# Patient Record
Sex: Female | Born: 1940 | Race: White | Hispanic: No | Smoking: Former smoker
Health system: Southern US, Community
[De-identification: ages and names within clinical notes are randomized; demographics above are authoritative.]

## PROBLEM LIST (undated history)

## (undated) DIAGNOSIS — E119 Type 2 diabetes mellitus without complications: Secondary | ICD-10-CM

## (undated) DIAGNOSIS — N289 Disorder of kidney and ureter, unspecified: Secondary | ICD-10-CM

## (undated) DIAGNOSIS — K219 Gastro-esophageal reflux disease without esophagitis: Secondary | ICD-10-CM

## (undated) DIAGNOSIS — H44813 Hemophthalmos, bilateral: Secondary | ICD-10-CM

## (undated) DIAGNOSIS — I10 Essential (primary) hypertension: Secondary | ICD-10-CM

## (undated) DIAGNOSIS — M1990 Unspecified osteoarthritis, unspecified site: Secondary | ICD-10-CM

## (undated) DIAGNOSIS — E079 Disorder of thyroid, unspecified: Secondary | ICD-10-CM

## (undated) DIAGNOSIS — N189 Chronic kidney disease, unspecified: Secondary | ICD-10-CM

## (undated) DIAGNOSIS — E559 Vitamin D deficiency, unspecified: Secondary | ICD-10-CM

## (undated) DIAGNOSIS — G4733 Obstructive sleep apnea (adult) (pediatric): Secondary | ICD-10-CM

## (undated) DIAGNOSIS — I2510 Atherosclerotic heart disease of native coronary artery without angina pectoris: Secondary | ICD-10-CM

## (undated) HISTORY — PX: TONSILLECTOMY: SUR1361

## (undated) HISTORY — PX: CHOLECYSTECTOMY: SHX55

## (undated) HISTORY — PX: CORONARY ANGIOPLASTY: SHX604

---

## 2004-08-03 ENCOUNTER — Ambulatory Visit: Payer: Self-pay | Admitting: Gastroenterology

## 2004-10-14 ENCOUNTER — Emergency Department: Payer: Self-pay | Admitting: Unknown Physician Specialty

## 2005-07-31 ENCOUNTER — Ambulatory Visit: Payer: Self-pay | Admitting: Internal Medicine

## 2005-09-15 ENCOUNTER — Ambulatory Visit: Payer: Self-pay | Admitting: Internal Medicine

## 2005-09-16 ENCOUNTER — Ambulatory Visit: Payer: Self-pay | Admitting: Internal Medicine

## 2005-09-21 ENCOUNTER — Ambulatory Visit: Payer: Self-pay | Admitting: Internal Medicine

## 2006-10-24 ENCOUNTER — Ambulatory Visit: Payer: Self-pay | Admitting: Internal Medicine

## 2007-10-15 ENCOUNTER — Inpatient Hospital Stay: Payer: Self-pay | Admitting: Internal Medicine

## 2007-12-04 ENCOUNTER — Ambulatory Visit: Payer: Self-pay | Admitting: Internal Medicine

## 2008-12-09 ENCOUNTER — Ambulatory Visit: Payer: Self-pay | Admitting: Internal Medicine

## 2009-01-11 ENCOUNTER — Emergency Department: Payer: Self-pay | Admitting: Emergency Medicine

## 2009-07-05 ENCOUNTER — Ambulatory Visit: Payer: Self-pay | Admitting: Family Medicine

## 2010-02-16 ENCOUNTER — Ambulatory Visit: Payer: Self-pay | Admitting: Internal Medicine

## 2011-03-09 ENCOUNTER — Ambulatory Visit: Payer: Self-pay | Admitting: Internal Medicine

## 2011-03-09 ENCOUNTER — Ambulatory Visit: Payer: Self-pay | Admitting: Gastroenterology

## 2011-03-11 ENCOUNTER — Ambulatory Visit: Payer: Self-pay | Admitting: Gastroenterology

## 2011-03-29 ENCOUNTER — Ambulatory Visit: Payer: Self-pay | Admitting: Gastroenterology

## 2011-04-01 LAB — PATHOLOGY REPORT

## 2011-04-12 ENCOUNTER — Other Ambulatory Visit: Payer: Self-pay | Admitting: Gastroenterology

## 2011-05-31 ENCOUNTER — Emergency Department: Payer: Self-pay | Admitting: Emergency Medicine

## 2011-08-08 ENCOUNTER — Ambulatory Visit: Payer: Self-pay | Admitting: Surgery

## 2011-08-09 LAB — PATHOLOGY REPORT

## 2012-07-02 ENCOUNTER — Ambulatory Visit: Payer: Self-pay | Admitting: Internal Medicine

## 2012-07-14 ENCOUNTER — Emergency Department: Payer: Self-pay | Admitting: Emergency Medicine

## 2012-07-14 LAB — RAPID INFLUENZA A&B ANTIGENS

## 2013-03-24 ENCOUNTER — Inpatient Hospital Stay: Payer: Self-pay | Admitting: Internal Medicine

## 2013-03-24 LAB — CBC
HCT: 42.1 % (ref 35.0–47.0)
HGB: 14.3 g/dL (ref 12.0–16.0)
MCH: 32.6 pg (ref 26.0–34.0)
MCHC: 33.9 g/dL (ref 32.0–36.0)
MCV: 96 fL (ref 80–100)
Platelet: 176 10*3/uL (ref 150–440)
RBC: 4.38 10*6/uL (ref 3.80–5.20)

## 2013-03-24 LAB — URINALYSIS, COMPLETE
Hyaline Cast: 14
Nitrite: NEGATIVE
WBC UR: 11 /HPF (ref 0–5)

## 2013-03-24 LAB — COMPREHENSIVE METABOLIC PANEL
Alkaline Phosphatase: 72 U/L (ref 50–136)
Bilirubin,Total: 0.7 mg/dL (ref 0.2–1.0)
Chloride: 101 mmol/L (ref 98–107)
Co2: 28 mmol/L (ref 21–32)
Creatinine: 1.53 mg/dL — ABNORMAL HIGH (ref 0.60–1.30)
Osmolality: 279 (ref 275–301)
Potassium: 3.5 mmol/L (ref 3.5–5.1)
SGPT (ALT): 17 U/L (ref 12–78)
Total Protein: 7.5 g/dL (ref 6.4–8.2)

## 2013-03-24 LAB — PRO B NATRIURETIC PEPTIDE: B-Type Natriuretic Peptide: 825 pg/mL — ABNORMAL HIGH (ref 0–125)

## 2013-03-25 LAB — CBC WITH DIFFERENTIAL/PLATELET
Eosinophil #: 0 10*3/uL (ref 0.0–0.7)
HCT: 37.5 % (ref 35.0–47.0)
HGB: 12.9 g/dL (ref 12.0–16.0)
MCHC: 34.3 g/dL (ref 32.0–36.0)
Monocyte #: 0.7 x10 3/mm (ref 0.2–0.9)
Monocyte %: 6.7 %
Neutrophil %: 87.1 %
RDW: 13.4 % (ref 11.5–14.5)

## 2013-03-25 LAB — BASIC METABOLIC PANEL
Calcium, Total: 8 mg/dL — ABNORMAL LOW (ref 8.5–10.1)
Chloride: 103 mmol/L (ref 98–107)
Co2: 26 mmol/L (ref 21–32)
Creatinine: 1.76 mg/dL — ABNORMAL HIGH (ref 0.60–1.30)
EGFR (African American): 33 — ABNORMAL LOW
EGFR (Non-African Amer.): 28 — ABNORMAL LOW
Glucose: 251 mg/dL — ABNORMAL HIGH (ref 65–99)
Osmolality: 288 (ref 275–301)
Potassium: 3.8 mmol/L (ref 3.5–5.1)
Sodium: 137 mmol/L (ref 136–145)

## 2013-03-26 LAB — CBC WITH DIFFERENTIAL/PLATELET
Basophil #: 0 10*3/uL (ref 0.0–0.1)
Eosinophil #: 0 10*3/uL (ref 0.0–0.7)
Eosinophil %: 0 %
HCT: 31.7 % — ABNORMAL LOW (ref 35.0–47.0)
HGB: 11.1 g/dL — ABNORMAL LOW (ref 12.0–16.0)
MCH: 33.2 pg (ref 26.0–34.0)
MCV: 95 fL (ref 80–100)
Neutrophil #: 7.6 10*3/uL — ABNORMAL HIGH (ref 1.4–6.5)
Neutrophil %: 81.4 %
RBC: 3.34 10*6/uL — ABNORMAL LOW (ref 3.80–5.20)
WBC: 9.3 10*3/uL (ref 3.6–11.0)

## 2013-03-26 LAB — BASIC METABOLIC PANEL
Anion Gap: 4 — ABNORMAL LOW (ref 7–16)
BUN: 33 mg/dL — ABNORMAL HIGH (ref 7–18)
Calcium, Total: 7.8 mg/dL — ABNORMAL LOW (ref 8.5–10.1)
Co2: 28 mmol/L (ref 21–32)
Creatinine: 1.71 mg/dL — ABNORMAL HIGH (ref 0.60–1.30)
Osmolality: 281 (ref 275–301)
Potassium: 3.9 mmol/L (ref 3.5–5.1)
Sodium: 137 mmol/L (ref 136–145)

## 2013-03-27 LAB — URINALYSIS, COMPLETE
Bilirubin,UR: NEGATIVE
Glucose,UR: NEGATIVE mg/dL (ref 0–75)
Leukocyte Esterase: NEGATIVE
Nitrite: NEGATIVE
Ph: 5 (ref 4.5–8.0)
Squamous Epithelial: NONE SEEN
WBC UR: <1 /HPF (ref 0–5)

## 2013-03-27 LAB — BASIC METABOLIC PANEL
Anion Gap: 5 — ABNORMAL LOW (ref 7–16)
Calcium, Total: 8.1 mg/dL — ABNORMAL LOW (ref 8.5–10.1)
Co2: 28 mmol/L (ref 21–32)
Creatinine: 1.34 mg/dL — ABNORMAL HIGH (ref 0.60–1.30)
EGFR (African American): 46 — ABNORMAL LOW
EGFR (Non-African Amer.): 39 — ABNORMAL LOW
Potassium: 3.5 mmol/L (ref 3.5–5.1)

## 2013-03-27 LAB — CREATININE, URINE, RANDOM: Creatinine, Urine Random: 51.7 mg/dL (ref 30.0–125.0)

## 2013-03-27 LAB — SODIUM, URINE, RANDOM: Sodium, Urine Random: 60 mmol/L (ref 20–110)

## 2013-03-27 LAB — HEMATOCRIT: HCT: 32.8 % — ABNORMAL LOW (ref 35.0–47.0)

## 2013-03-28 LAB — BASIC METABOLIC PANEL
Anion Gap: 5 — ABNORMAL LOW (ref 7–16)
Calcium, Total: 8 mg/dL — ABNORMAL LOW (ref 8.5–10.1)
Chloride: 107 mmol/L (ref 98–107)
Co2: 27 mmol/L (ref 21–32)
Creatinine: 1.22 mg/dL (ref 0.60–1.30)
Sodium: 139 mmol/L (ref 136–145)

## 2013-03-29 LAB — CULTURE, BLOOD (SINGLE)

## 2013-04-22 LAB — EXPECTORATED SPUTUM ASSESSMENT W REFEX TO RESP CULTURE

## 2013-09-10 ENCOUNTER — Ambulatory Visit: Payer: Self-pay | Admitting: Internal Medicine

## 2014-10-24 NOTE — Discharge Summary (Signed)
PATIENT NAME:  Suzanne Francis, Suzanne Francis MR#:  [id number] DATE OF BIRTH:  1940/08/27  DATE OF ADMISSION:  03/24/2013 DATE OF DISCHARGE:  03/28/2013  FINAL DIAGNOSES: 1.  Pneumonia.  2.  Systemic inflammatory response syndrome secondary to #1.  3.  Acute renal failure.  4.  Adult onset diabetes mellitus, uncontrolled.  5.  Sleep apnea.  6.  Gastroesophageal reflux disease.  7 . Depression/anxiety.  8.  Hypothyroidism.   HISTORY AND PHYSICAL: Please see dictated admission history and physical.   SUMMARY OF HOSPITAL COURSE: The patient was admitted with pneumonia with evidence of systemic inflammatory response syndrome, also developed acute renal failure thought to be secondary to acute tubular necrosis from the systemic inflammatory response syndrome. She was placed on antibiotics and showed improvement. She was changed over to Ceftin and Zithromax, however, she spiked fevers on this and so she was changed over to Levaquin. With this regimen she remained afebrile. She is ambulating well, saturation improved to the point that she was at 95% on room air on day of discharge. Blood pressure medications were initially held secondary to hypotension. These were reinstituted. Metformin was held secondary to acute renal failure. Creatinine improved to baseline, however, she did not require the metformin at this point and so it continued to be held. She did have evidence of urinary tract infection as well, which was well covered with the Levaquin.   At this time, the patient will be discharged to home in stable condition with physical activity to be up as tolerated. She may return to work on Monday. We will have her follow up in the office within the next 2 weeks. Her diet should be 2 grams sodium, 1800 calorie ADA diet. She should check her blood sugar daily and record this.   DISCHARGE MEDICATIONS: 1.  Multivitamin 1 p.o. daily.  2.  Calcium 600 mg p.o. daily. 3.  Actos 45 mg p.o. daily.  4.   Hydrochlorothiazide 25 mg p.o. daily.  5.  Glipizide XL 10 mg p.o. daily.  6.  Pantoprazole 40 mg p.o. daily.  7.  Levothyroxine 0.112 mg p.o. daily.  8.  Lexapro 10 mg p.o. daily.  9.  Lisinopril 40 mg 1/2 tablet p.o. daily.  10.  Tessalon 100 mg p.o. q. 6 hours as needed for cough.  11.  Levaquin 500 mg p.o. daily x 10 days to complete 14 day course.   NOTE:  She will hold metformin at this point. If her blood sugars are getting over 150, she can restart this medication at 1000 mg p.o. b.i.d.  ____________________________ Lynnea FerrierBert J. Klein III, MD bjk:sb D: 03/28/2013 08:17:48 ET T: 03/28/2013 08:34:02 ET JOB#: [id number]  cc: Curtis SitesBert J. Klein III, MD, <Dictator> Daniel NonesBERT KLEIN MD ELECTRONICALLY SIGNED 04/03/2013 13:03

## 2014-10-24 NOTE — H&P (Signed)
PATIENT NAME:  Suzanne Francis, Suzanne Francis MR#:  [id number] DATE OF BIRTH:  07/08/1940  DATE OF ADMISSION:  03/24/2013  PRIMARY CARE PHYSICIAN: Dr. Daniel Nones.   CHIEF COMPLAINT: Cough and fever for 2 to 3 days.   HISTORY OF PRESENT ILLNESS: Suzanne Francis is a very pleasant, 74 year old, Caucasian female with a history of sleep apnea, hypertension and type 2 diabetes who comes to the Emergency Room accompanied by her daughter with increasing dry cough and shortness of breath along with fever of 103. The patient was found to be tachycardic, and her chest x-ray shows developing left lower lobe infiltrate. She is being admitted with SIRS secondary to pneumonia. The patient denies any productive phlegm. She has been having pain in her back with coughing spells. Her sats are 97% on 2 liters. She received IV Rocephin and Zithromax in the Emergency Room.   PAST MEDICAL HISTORY:  1. Hypothyroidism.  2. Hypertension.  3. Type 2 diabetes.  4. Sleep apnea, wears CPAP at home.  5. Acid reflux/hiatal hernia.  6. Depression and anxiety.   PAST SURGICAL HISTORY:  1. Tonsillectomy and adenoidectomy.  2. Tubal ligation.   ALLERGIES: No known drug allergies.   MEDICATIONS:  1. Pioglitazone 45 mg p.o. daily.  2. Protonix 40 mg daily.  3. Metformin 1000 mg b.i.d.  4. Lisinopril 40 mg once a day.  5. Levothyroxine 112 mcg p.o. daily.  6. Hydrochlorothiazide 25 mg daily.  7. Glipizide 10 mg daily.  8. Escitalopram 10 mg daily.  9. Centrum Silver p.o. daily.  10. Caltrate with vitamin D 1 tablet daily.  11. Biotin 5000 mcg daily.  12. Albuterol 1 puff 4 times a day.   SOCIAL HISTORY: Lives at home by herself. The patient is a former smoker. Denies any alcohol drinking.   FAMILY HISTORY: Positive for hypertension and diabetes.   REVIEW OF SYSTEMS:   CONSTITUTIONAL: Positive for fever, fatigue, weakness.  EYES: No blurred or double vision or glaucoma or cataracts.  EARS, NOSE, THROAT: No tinnitus, ear pain,  hearing loss or postnasal drip.  RESPIRATORY: Positive for cough, shortness of breath and COPD.   CARDIOVASCULAR: Positive for chest pain on coughing spells. No orthopnea, edema. Positive for hypertension.  GASTROINTESTINAL: No nausea, vomiting, diarrhea, abdominal pain or hematemesis.  GENITOURINARY: No dysuria, hematuria, frequency or incontinence.  ENDOCRINE: No polyuria, nocturia or thyroid problems.  HEMATOLOGY: No anemia or easy bruising or bleeding.  SKIN: No acne or rash.  MUSCULOSKELETAL: Positive for arthritis. No swelling or gout.  NEUROLOGIC: CVA, TIA, weakness or ataxia.  PSYCHIATRIC: Positive for anxiety and depression. No bipolar disorder or schizophrenia.   All other systems reviewed and negative.   PHYSICAL EXAMINATION:  GENERAL: The patient is awake, alert, oriented x3, not in acute distress.  VITAL SIGNS: Temperature 103.2, pulse 103, respirations 20, blood pressure 127/63. Sats are 97% on 2 liters.  HEENT: Atraumatic, normocephalic. PERRLA. EOM intact. Oral mucosa is dry.  NECK: Supple. No JVD. No carotid bruit.  RESPIRATORY: There are decreased breath sounds at the bases and a few crackles heard bilaterally at the bases more on the left than the right. No respiratory distress or use of accessory muscles.  CARDIOVASCULAR: Tachycardia present. No murmur heard. PMI nonlateralized. Chest nontender.  EXTREMITIES: Good pedal pulses, good femoral pulses. No lower extremity edema.  ABDOMEN: Soft, benign, nontender. No organomegaly. Positive bowel sounds.  NEUROLOGIC: Grossly intact cranial nerves II through XII. No motor or sensory deficit.  PSYCHIATRIC: The patient is awake, alert, oriented  x3.  SKIN: Warm and dry.   LABORATORY DATA: CBC within normal limits. Comprehensive metabolic panel within normal limits except creatinine of 1.5, glucose of 157. Troponin of 0.02. Chest x-ray shows left lower lobe airspace disease concerning for pneumonia.   ASSESSMENT AND PLAN: Suzanne Francis is a 74 year old with history of hypertension and type 2 diabetes, comes in with shortness of breath, dry cough and high-grade fever, is being admitted with:  1. Systemic inflammatory response syndrome secondary to left lower lobe pneumonia. The patient has temperature of 103. She is tachycardic with a normal chest x-ray. Will admit the patient on medical floor with off unit telemetry. Continue intravenous Rocephin and Zithromax. Follow up blood cultures and sputum culture if patient is able to produce any. Give nebulizers as needed.  2. Type 2 diabetes: Will place the patient on sliding scale insulin and continue home medications.  3. Mild renal insufficiency/acute renal failure in the setting of systemic inflammatory response syndrome due to pneumonia: Will give intravenous fluids for hydration. Monitor creatinine.   4. Gastroesophageal reflux disease: Continue Protonix.  5. Depression and anxiety: Will continue Lexapro.  6. History of hypertension: I will hold off on the patient's lisinopril at this time because of elevated creatinine. Will hydrate her and resume lisinopril tomorrow once creatinine is back to the patient's baseline.   7. Hypothyroidism: Continue Synthroid.  8. Further workup according to the patient's clinical course. Hospital admission plan was discussed with the patient and the patient's daughter.   CRITICAL TIME SPENT: 50 minutes.   ____________________________ Wylie HailSona A. Allena KatzPatel, MD sap:gb D: 03/24/2013 18:04:02 ET T: 03/24/2013 18:24:02 ET JOB#: [id number]  cc: Victoriana Aziz A. Allena KatzPatel, MD, <Dictator> Curtis SitesBert J. Klein III, MD Willow OraSONA A Juriel Cid MD ELECTRONICALLY SIGNED 03/25/2013 13:13

## 2014-10-26 NOTE — Op Note (Signed)
PATIENT NAME:  Suzanne Francis, Suzanne Francis MR#:  [id number] DATE OF BIRTH:  04-13-1941  DATE OF PROCEDURE:  08/08/2011  PREOPERATIVE DIAGNOSIS: Chronic cholecystitis, cholelithiasis.   POSTOPERATIVE DIAGNOSIS: Chronic cholecystitis, cholelithiasis.   PROCEDURE: Laparoscopic cholecystectomy, cholangiogram.   SURGEON: J. Renda RollsWilton Lyncoln Maskell, MD   ASSISTANT: Carmell AustriaAnn Collins, PA  ANESTHESIA: General.   INDICATIONS: This 74 year old female has a history of epigastric pains and ultrasound findings of large gallstones and surgery was recommended for definitive treatment.   DESCRIPTION OF PROCEDURE: The patient was placed on the operating table in the supine position under general endotracheal anesthesia. The abdomen was prepared with ChloraPrep and draped in a sterile manner.   A short incision was made in the inferior aspect of the umbilicus and carried down to the deep fascia which was grasped with laryngeal hook and elevated. A Veress needle was inserted, aspirated, and irrigated with a saline solution. Next, the peritoneal cavity was inflated with carbon dioxide. The Veress needle was removed. The 10 mm cannula was inserted. The 10 mm 0 degree laparoscope was inserted to view the peritoneal cavity. The liver appeared normal. The gallbladder appeared to have a slightly thickened wall. Another incision was made in the epigastrium slightly to the right of the midline to introduce a 10 mm cannula. Next, two incisions were made in the lateral aspect of the right upper quadrant to introduce two 5-mm cannulas.   The gallbladder was retracted towards the right shoulder. The infundibulum was retracted inferiorly and laterally. The porta hepatis was demonstrated. The neck of the gallbladder was mobilized with incision of visceral peritoneum. The cystic duct was dissected free from surrounding structures. The cystic artery was dissected free from surrounding structures. A critical view of safety was demonstrated. An Endoclip was  placed across the cystic duct adjacent to the neck of the gallbladder. An incision was made in the cystic duct to introduce a Reddick catheter. Half-strength Conray-60 dye was injected as the cholangiogram was done with fluoroscopy. This demonstrated biliary tree and prompt flow of dye into the duodenum. No retained stones were seen. The cholangiogram appeared normal. The cholangiocatheter was removed. The cystic duct was doubly ligated with endoclips and divided. The cystic artery was controlled with a single Endoclip and divided The gallbladder was dissected free from the liver with hook and cautery. Bleeding was very scant. Hemostasis was subsequently intact. The site was irrigated with heparinized saline solution and aspirated. The gallbladder was brought up through the infraumbilical incision, opened and suctioned. There was a large stone which was crushed and removed in a piecemeal fashion. The gallbladder with stones were then removed and submitted in formalin for routine pathology. There was some oozing from the infraumbilical port site as seen with the laparoscope and one small bleeding point was cauterized. The site was infiltrated with 1% Xylocaine with epinephrine. Hemostasis subsequently appeared to be intact. The other cannulas were removed allowing carbon dioxide to escape from the peritoneal cavity. Several tiny bleeding points in the subcutaneous tissues were cauterized. Each wound was infiltrated with Xylocaine with epinephrine. Hemostasis was intact. The wounds were closed with interrupted 5-0 chromic subcuticular sutures, benzoin, and Steri-Strips. Dressings were applied with paper tape. The patient is now being prepared for transfer to the recovery room.   ____________________________ Shela CommonsJ. Renda RollsWilton Dameka Younker, MD jws:drc D: 08/08/2011 08:37:46 ET T: 08/08/2011 10:20:42 ET JOB#: [id number]  cc: Adella HareJ. Wilton Sharin Altidor, MD, <Dictator> Adella HareWILTON J Vonne Mcdanel MD ELECTRONICALLY SIGNED 08/13/2011 13:55

## 2015-03-24 ENCOUNTER — Inpatient Hospital Stay
Admission: EM | Admit: 2015-03-24 | Discharge: 2015-03-27 | DRG: 247 | Disposition: A | Discharge status: Home / Self Care | Payer: Medicare Other | Attending: Internal Medicine | Admitting: Internal Medicine

## 2015-03-24 ENCOUNTER — Emergency Department: Payer: Medicare Other

## 2015-03-24 ENCOUNTER — Encounter: Payer: Self-pay | Admitting: Emergency Medicine

## 2015-03-24 ENCOUNTER — Inpatient Hospital Stay
Admit: 2015-03-24 | Discharge: 2015-03-24 | Disposition: A | Discharge status: Home / Self Care | Payer: Medicare Other | Attending: Internal Medicine | Admitting: Internal Medicine

## 2015-03-24 DIAGNOSIS — I25110 Atherosclerotic heart disease of native coronary artery with unstable angina pectoris: Secondary | ICD-10-CM | POA: Diagnosis present

## 2015-03-24 DIAGNOSIS — E039 Hypothyroidism, unspecified: Secondary | ICD-10-CM | POA: Diagnosis present

## 2015-03-24 DIAGNOSIS — Z7982 Long term (current) use of aspirin: Secondary | ICD-10-CM | POA: Diagnosis not present

## 2015-03-24 DIAGNOSIS — I129 Hypertensive chronic kidney disease with stage 1 through stage 4 chronic kidney disease, or unspecified chronic kidney disease: Secondary | ICD-10-CM | POA: Diagnosis present

## 2015-03-24 DIAGNOSIS — Z880 Allergy status to penicillin: Secondary | ICD-10-CM

## 2015-03-24 DIAGNOSIS — Z87891 Personal history of nicotine dependence: Secondary | ICD-10-CM

## 2015-03-24 DIAGNOSIS — G4733 Obstructive sleep apnea (adult) (pediatric): Secondary | ICD-10-CM | POA: Diagnosis present

## 2015-03-24 DIAGNOSIS — N189 Chronic kidney disease, unspecified: Secondary | ICD-10-CM | POA: Diagnosis present

## 2015-03-24 DIAGNOSIS — Z794 Long term (current) use of insulin: Secondary | ICD-10-CM | POA: Diagnosis not present

## 2015-03-24 DIAGNOSIS — Z79899 Other long term (current) drug therapy: Secondary | ICD-10-CM | POA: Diagnosis not present

## 2015-03-24 DIAGNOSIS — J984 Other disorders of lung: Secondary | ICD-10-CM | POA: Diagnosis present

## 2015-03-24 DIAGNOSIS — K219 Gastro-esophageal reflux disease without esophagitis: Secondary | ICD-10-CM | POA: Diagnosis present

## 2015-03-24 DIAGNOSIS — E1122 Type 2 diabetes mellitus with diabetic chronic kidney disease: Secondary | ICD-10-CM | POA: Diagnosis present

## 2015-03-24 DIAGNOSIS — E559 Vitamin D deficiency, unspecified: Secondary | ICD-10-CM | POA: Diagnosis present

## 2015-03-24 DIAGNOSIS — Z6841 Body Mass Index (BMI) 40.0 and over, adult: Secondary | ICD-10-CM | POA: Diagnosis not present

## 2015-03-24 DIAGNOSIS — I214 Non-ST elevation (NSTEMI) myocardial infarction: Secondary | ICD-10-CM | POA: Diagnosis present

## 2015-03-24 DIAGNOSIS — R079 Chest pain, unspecified: Secondary | ICD-10-CM

## 2015-03-24 DIAGNOSIS — R0902 Hypoxemia: Secondary | ICD-10-CM | POA: Diagnosis not present

## 2015-03-24 DIAGNOSIS — E669 Obesity, unspecified: Secondary | ICD-10-CM | POA: Diagnosis present

## 2015-03-24 HISTORY — DX: Essential (primary) hypertension: I10

## 2015-03-24 HISTORY — DX: Gastro-esophageal reflux disease without esophagitis: K21.9

## 2015-03-24 HISTORY — DX: Type 2 diabetes mellitus without complications: E11.9

## 2015-03-24 HISTORY — DX: Vitamin D deficiency, unspecified: E55.9

## 2015-03-24 HISTORY — DX: Obstructive sleep apnea (adult) (pediatric): G47.33

## 2015-03-24 HISTORY — DX: Disorder of thyroid, unspecified: E07.9

## 2015-03-24 HISTORY — DX: Chronic kidney disease, unspecified: N18.9

## 2015-03-24 LAB — CBC
HCT: 44.5 % (ref 35.0–47.0)
HEMOGLOBIN: 14.6 g/dL (ref 12.0–16.0)
MCH: 31.8 pg (ref 26.0–34.0)
MCHC: 32.9 g/dL (ref 32.0–36.0)
MCV: 96.6 fL (ref 80.0–100.0)
Platelets: 251 10*3/uL (ref 150–440)
RBC: 4.6 MIL/uL (ref 3.80–5.20)
RDW: 12.8 % (ref 11.5–14.5)
WBC: 6.1 10*3/uL (ref 3.6–11.0)

## 2015-03-24 LAB — BASIC METABOLIC PANEL
ANION GAP: 10 (ref 5–15)
BUN: 21 mg/dL — AB (ref 6–20)
CALCIUM: 10 mg/dL (ref 8.9–10.3)
CO2: 29 mmol/L (ref 22–32)
Chloride: 101 mmol/L (ref 101–111)
Creatinine, Ser: 1.23 mg/dL — ABNORMAL HIGH (ref 0.44–1.00)
GFR calc Af Amer: 49 mL/min — ABNORMAL LOW (ref >60)
GFR, EST NON AFRICAN AMERICAN: 42 mL/min — AB (ref >60)
GLUCOSE: 207 mg/dL — AB (ref 65–99)
Potassium: 3.7 mmol/L (ref 3.5–5.1)
Sodium: 140 mmol/L (ref 135–145)

## 2015-03-24 LAB — PROTIME-INR
INR: 0.93
Prothrombin Time: 12.7 seconds (ref 11.4–15.0)

## 2015-03-24 LAB — LIPID PANEL
CHOLESTEROL: 188 mg/dL (ref 0–200)
HDL: 43 mg/dL (ref >40)
LDL Cholesterol: 104 mg/dL — ABNORMAL HIGH (ref 0–99)
TRIGLYCERIDES: 204 mg/dL — AB (ref <150)
Total CHOL/HDL Ratio: 4.4 RATIO
VLDL: 41 mg/dL — ABNORMAL HIGH (ref 0–40)

## 2015-03-24 LAB — APTT: aPTT: 29 seconds (ref 24–36)

## 2015-03-24 LAB — TROPONIN I
TROPONIN I: 0.47 ng/mL — AB (ref <0.031)
Troponin I: 0.34 ng/mL — ABNORMAL HIGH (ref <0.031)
Troponin I: 0.62 ng/mL — ABNORMAL HIGH (ref <0.031)

## 2015-03-24 LAB — GLUCOSE, CAPILLARY: Glucose-Capillary: 158 mg/dL — ABNORMAL HIGH (ref 65–99)

## 2015-03-24 MED ORDER — PIOGLITAZONE HCL 30 MG PO TABS
30.0000 mg | ORAL_TABLET | Freq: Every day | ORAL | Status: DC
  Administered 2015-03-26 – 2015-03-27 (×2): 30 mg via ORAL
  Filled 2015-03-24 (×3): qty 1

## 2015-03-24 MED ORDER — HEPARIN BOLUS VIA INFUSION
3900.0000 [IU] | Freq: Once | INTRAVENOUS | Status: AC
  Administered 2015-03-24: 3900 [IU] via INTRAVENOUS
  Filled 2015-03-24: qty 3900

## 2015-03-24 MED ORDER — INSULIN ASPART 100 UNIT/ML ~~LOC~~ SOLN
0.0000 [IU] | Freq: Three times a day (TID) | SUBCUTANEOUS | Status: DC
  Administered 2015-03-26: 2 [IU] via SUBCUTANEOUS
  Administered 2015-03-26 – 2015-03-27 (×3): 1 [IU] via SUBCUTANEOUS
  Filled 2015-03-24: qty 2
  Filled 2015-03-24 (×3): qty 1

## 2015-03-24 MED ORDER — ONDANSETRON HCL 4 MG/2ML IJ SOLN
INTRAMUSCULAR | Status: AC
  Administered 2015-03-24: 4 mg via INTRAVENOUS
  Filled 2015-03-24: qty 2

## 2015-03-24 MED ORDER — INSULIN ASPART 100 UNIT/ML ~~LOC~~ SOLN: 0.0000 [IU] | Freq: Every day | SUBCUTANEOUS | Status: DC

## 2015-03-24 MED ORDER — ROSUVASTATIN CALCIUM 20 MG PO TABS
20.0000 mg | ORAL_TABLET | Freq: Every day | ORAL | Status: DC
  Administered 2015-03-26: 20 mg via ORAL
  Filled 2015-03-24 (×2): qty 1

## 2015-03-24 MED ORDER — MORPHINE SULFATE (PF) 4 MG/ML IV SOLN
4.0000 mg | Freq: Once | INTRAVENOUS | Status: AC
  Administered 2015-03-24: 4 mg via INTRAVENOUS

## 2015-03-24 MED ORDER — MORPHINE SULFATE (PF) 4 MG/ML IV SOLN
INTRAVENOUS | Status: AC
  Administered 2015-03-24: 4 mg via INTRAVENOUS
  Filled 2015-03-24: qty 1

## 2015-03-24 MED ORDER — ACETAMINOPHEN 325 MG PO TABS: 650.0000 mg | ORAL_TABLET | Freq: Four times a day (QID) | ORAL | Status: DC | PRN

## 2015-03-24 MED ORDER — NITROGLYCERIN 2 % TD OINT
1.0000 [in_us] | TOPICAL_OINTMENT | Freq: Four times a day (QID) | TRANSDERMAL | Status: DC
  Administered 2015-03-24 – 2015-03-25 (×3): 1 [in_us] via TOPICAL
  Filled 2015-03-24 (×3): qty 1

## 2015-03-24 MED ORDER — IOHEXOL 350 MG/ML SOLN
80.0000 mL | Freq: Once | INTRAVENOUS | Status: AC | PRN
  Administered 2015-03-24: 80 mL via INTRAVENOUS

## 2015-03-24 MED ORDER — SODIUM CHLORIDE 0.9 % IV BOLUS (SEPSIS)
500.0000 mL | Freq: Once | INTRAVENOUS | Status: AC
  Administered 2015-03-24: 500 mL via INTRAVENOUS

## 2015-03-24 MED ORDER — METOPROLOL TARTRATE 25 MG PO TABS
25.0000 mg | ORAL_TABLET | Freq: Two times a day (BID) | ORAL | Status: DC
  Administered 2015-03-25 – 2015-03-26 (×3): 25 mg via ORAL
  Filled 2015-03-24 (×4): qty 1

## 2015-03-24 MED ORDER — SODIUM CHLORIDE 0.9 % IV SOLN
INTRAVENOUS | Status: DC
  Administered 2015-03-24 – 2015-03-25 (×2): via INTRAVENOUS

## 2015-03-24 MED ORDER — ONDANSETRON HCL 4 MG/2ML IJ SOLN
4.0000 mg | Freq: Once | INTRAMUSCULAR | Status: AC
  Administered 2015-03-24: 4 mg via INTRAVENOUS

## 2015-03-24 MED ORDER — SODIUM CHLORIDE 0.9 % IJ SOLN
3.0000 mL | Freq: Two times a day (BID) | INTRAMUSCULAR | Status: DC
  Administered 2015-03-25 – 2015-03-26 (×2): 3 mL via INTRAVENOUS

## 2015-03-24 MED ORDER — MORPHINE SULFATE (PF) 2 MG/ML IV SOLN
2.0000 mg | INTRAVENOUS | Status: DC | PRN
  Administered 2015-03-24 – 2015-03-25 (×3): 2 mg via INTRAVENOUS
  Filled 2015-03-24 (×2): qty 1

## 2015-03-24 MED ORDER — LISINOPRIL 20 MG PO TABS
40.0000 mg | ORAL_TABLET | Freq: Every day | ORAL | Status: DC
  Administered 2015-03-26 – 2015-03-27 (×2): 40 mg via ORAL
  Filled 2015-03-24 (×4): qty 2

## 2015-03-24 MED ORDER — LEVOTHYROXINE SODIUM 125 MCG PO TABS
125.0000 ug | ORAL_TABLET | Freq: Every day | ORAL | Status: DC
  Administered 2015-03-26 – 2015-03-27 (×2): 125 ug via ORAL
  Filled 2015-03-24 (×2): qty 1

## 2015-03-24 MED ORDER — GLIPIZIDE ER 2.5 MG PO TB24: 10.0000 mg | ORAL_TABLET | Freq: Every day | ORAL | Status: DC

## 2015-03-24 MED ORDER — ESCITALOPRAM OXALATE 10 MG PO TABS
10.0000 mg | ORAL_TABLET | Freq: Every day | ORAL | Status: DC
  Administered 2015-03-26 – 2015-03-27 (×2): 10 mg via ORAL
  Filled 2015-03-24 (×2): qty 1

## 2015-03-24 MED ORDER — HEPARIN (PORCINE) IN NACL 100-0.45 UNIT/ML-% IJ SOLN
950.0000 [IU]/h | INTRAMUSCULAR | Status: DC
  Administered 2015-03-24 (×2): 800 [IU]/h via INTRAVENOUS
  Filled 2015-03-24 (×2): qty 250

## 2015-03-24 MED ORDER — OXYCODONE HCL 5 MG PO TABS
5.0000 mg | ORAL_TABLET | ORAL | Status: DC | PRN
  Administered 2015-03-25: 5 mg via ORAL
  Filled 2015-03-24: qty 1

## 2015-03-24 MED ORDER — ASPIRIN EC 325 MG PO TBEC: 325.0000 mg | DELAYED_RELEASE_TABLET | Freq: Every day | ORAL | Status: DC

## 2015-03-24 MED ORDER — ACETAMINOPHEN 650 MG RE SUPP: 650.0000 mg | Freq: Four times a day (QID) | RECTAL | Status: DC | PRN

## 2015-03-24 NOTE — Progress Notes (Signed)
*  PRELIMINARY RESULTS* Echocardiogram 2D Echocardiogram has been performed.  Suzanne Francis 03/24/2015, 6:06 PM

## 2015-03-24 NOTE — Progress Notes (Signed)
Skin verified with Gwenlyn Found., RN.

## 2015-03-24 NOTE — ED Notes (Signed)
Patient transported to CT 

## 2015-03-24 NOTE — H&P (Signed)
[facility] [place] [facility]   PATIENT NAME: Suzanne Francis    MR#:  [id number]  DATE OF BIRTH:  11-09-1940  DATE OF ADMISSION:  03/24/2015  PRIMARY CARE PHYSICIAN: Dr. Daniel Nones  REQUESTING/REFERRING PHYSICIAN: Dr. Toney Rakes  CHIEF COMPLAINT:   Chief Complaint  Patient presents with  . Chest Pain    HISTORY OF PRESENT ILLNESS:  Suzanne Francis  is a 74 y.o. female with a known history of hypertension, non-insulin-dependent diabetes mellitus, sleep apnea, hypothyroidism, no prior cardiac disease comes to the emergency room secondary to chest pain. Patient hasn't had any recent episodes of chest pain. Her pain started last evening he was heavy in nature. Associated with diaphoresis, nausea and she had vomited twice since last night. She thought initially it was indigestion and took some tums. It did not improve the pain and she took some aspirin and went to bed. All night long she couldn't sleep because of the pain and due to vomiting episode twice she presented to the emergency room. She had 10 on 10 pain here in the ER. Relieved with morphine. No previous cardiac workup done. CT of the chest was done in the ER, did not show any pulmonary causes for pain and negative for PE. Her first troponin was elevated at 0.34. Patient is being admitted for non-ST segment elevation MI. EKG only shows T-wave inversions in lead 3.  PAST MEDICAL HISTORY:   Past Medical History  Diagnosis Date  . Diabetes mellitus without complication   . Hypertension   . Thyroid disease     hypothyroidism  . OSA (obstructive sleep apnea)   . Vitamin D deficiency   . GERD (gastroesophageal reflux disease)   . CKD (chronic kidney disease)     PAST SURGICAL HISTORY:   Past Surgical History  Procedure Laterality Date  . Cholecystectomy    . Tonsillectomy      SOCIAL HISTORY:   Social History  Substance Use Topics  . Smoking status: Former Smoker    Quit date:  03/24/1975  . Smokeless tobacco: Not on file  . Alcohol Use: 0.0 oz/week    0 Standard drinks or equivalent per week     Comment: glass of wine occasionally    FAMILY HISTORY:   Family History  Problem Relation Age of Onset  . CVA Father   . CAD Mother     DRUG ALLERGIES:   Allergies  Allergen Reactions  . Penicillins Hives and Other (See Comments)    Has patient had a PCN reaction causing immediate rash, facial/tongue/throat swelling, SOB or lightheadedness with hypotension: No Has patient had a PCN reaction causing severe rash involving mucus membranes or skin necrosis: No Has patient had a PCN reaction that required hospitalization No Has patient had a PCN reaction occurring within the last 10 years: Yes If all of the above answers are "NO", then may proceed with Cephalosporin use.    REVIEW OF SYSTEMS:   Review of Systems  Constitutional: Negative for fever, chills, weight loss and malaise/fatigue.  HENT: Positive for hearing loss. Negative for ear discharge, ear pain, nosebleeds and tinnitus.   Eyes: Negative for blurred vision, double vision and photophobia.  Respiratory: Negative for cough, hemoptysis, shortness of breath and wheezing.   Cardiovascular: Positive for chest pain and leg swelling. Negative for palpitations and orthopnea.  Gastrointestinal: Negative for heartburn, nausea, vomiting, abdominal pain, diarrhea, constipation and melena.  Genitourinary: Negative for dysuria, urgency, frequency and hematuria.  Musculoskeletal: Negative  for myalgias, back pain and neck pain.  Skin: Negative for rash.  Neurological: Negative for dizziness, tingling, tremors, sensory change, speech change, focal weakness and headaches.  Endo/Heme/Allergies: Does not bruise/bleed easily.  Psychiatric/Behavioral: Negative for depression.    MEDICATIONS AT HOME:   Prior to Admission medications   Medication Sig Start Date End Date Taking? Authorizing Provider  colchicine 0.6 MG  tablet Take 0.6-1.2 mg by mouth as needed (for gout flares).   Yes Historical Provider, MD  escitalopram (LEXAPRO) 10 MG tablet Take 10 mg by mouth daily.   Yes Historical Provider, MD  glipiZIDE (GLUCOTROL XL) 10 MG 24 hr tablet Take 10 mg by mouth daily.   Yes Historical Provider, MD  hydrochlorothiazide (HYDRODIURIL) 25 MG tablet Take 25 mg by mouth daily.   Yes Historical Provider, MD  levothyroxine (SYNTHROID, LEVOTHROID) 125 MCG tablet Take 125 mcg by mouth daily before breakfast.   Yes Historical Provider, MD  lisinopril (PRINIVIL,ZESTRIL) 40 MG tablet Take 40 mg by mouth daily.   Yes Historical Provider, MD  metFORMIN (GLUCOPHAGE) 500 MG tablet Take 500 mg by mouth 2 (two) times daily with a meal.   Yes Historical Provider, MD  pioglitazone (ACTOS) 30 MG tablet Take 30 mg by mouth daily.   Yes Historical Provider, MD      VITAL SIGNS:  Blood pressure 159/75, pulse 75, temperature 97.5 F (36.4 C), temperature source Oral, resp. rate 12, height  (1.499 m), weight 90.719 kg (200 lb), SpO2 100 %.  PHYSICAL EXAMINATION:   Physical Exam  GENERAL:  74 y.o.-year-old patient lying in the bed with no acute distress.  EYES: Pupils equal, round, reactive to light and accommodation. No scleral icterus. Extraocular muscles intact.  HEENT: Head atraumatic, normocephalic. Oropharynx and nasopharynx clear.  NECK:  Supple, no jugular venous distention. No thyroid enlargement, no tenderness.  LUNGS: Normal breath sounds bilaterally, no wheezing, rales,rhonchi or crepitation. No use of accessory muscles of respiration.  CARDIOVASCULAR: S1, S2 normal. No murmurs, rubs, or gallops.  ABDOMEN: Soft, nontender, nondistended. Bowel sounds present. No organomegaly or mass.  EXTREMITIES: No pedal edema, cyanosis, or clubbing.  NEUROLOGIC: Cranial nerves II through XII are intact. Muscle strength 5/5 in all extremities. Sensation intact. Gait not checked.  PSYCHIATRIC: The patient is alert and oriented  x 3.  SKIN: No obvious rash, lesion, or ulcer.   LABORATORY PANEL:   CBC  Recent Labs Lab 03/24/15 1155  WBC 6.1  HGB 14.6  HCT 44.5  PLT 251   ------------------------------------------------------------------------------------------------------------------  Chemistries   Recent Labs Lab 03/24/15 1155  NA 140  K 3.7  CL 101  CO2 29  GLUCOSE 207*  BUN 21*  CREATININE 1.23*  CALCIUM 10.0   ------------------------------------------------------------------------------------------------------------------  Cardiac Enzymes  Recent Labs Lab 03/24/15 1155  TROPONINI 0.34*   ------------------------------------------------------------------------------------------------------------------  RADIOLOGY:  Dg Chest 2 View  03/24/2015   CLINICAL DATA:  Chest pain  EXAM: CHEST  2 VIEW  COMPARISON:  March 26, 2013  FINDINGS: The heart size and mediastinal contours are within normal limits. There is no pulmonary edema or pleural effusion. Mild patchy consolidation medial right lung base is identified. The visualized skeletal structures are stable.  IMPRESSION: Mild patchy consolidation of medial right lung base, developing pneumonia is not excluded.   Electronically Signed   By: Sherian Rein M.D.   On: 03/24/2015 12:57   Ct Angio Chest Aorta W/cm &/or Wo/cm  03/24/2015   CLINICAL DATA:  Midsternal chest pain, radiating to the back.  EXAM: CT ANGIOGRAPHY CHEST WITH CONTRAST  TECHNIQUE: Multidetector CT imaging of the chest was performed using the standard protocol during bolus administration of intravenous contrast. Multiplanar CT image reconstructions and MIPs were obtained to evaluate the vascular anatomy.  CONTRAST:  80mL OMNIPAQUE IOHEXOL 350 MG/ML SOLN  COMPARISON:  Chest radiograph from the same date.  FINDINGS: There is no evidence of large central pulmonary embolus.  There is no evidence of thoracic aneurysm on dissection. Conventional branching of the thoracic aorta. Mild to  moderate atherosclerotic disease of the aorta are with calcified plaque. The heart is normal in size. There is no evidence of pericardial effusion. No evidence of mediastinal or hilar lymphadenopathy. There is a short segment of circumferential thickening of the distal esophagus, few cm upstream from small hiatal hernia.  There is no evidence of significant focal lung parenchymal consolidation, pleural effusion or pneumothorax. No masses are identified ; no abnormal focal contrast enhancement is seen. Right middle lobe atelectasis versus scarring noted.  No axillary lymphadenopathy is seen. The visualized portions of the thyroid gland are unremarkable in appearance.  The visualized portions of the spleen are unremarkable. Tiny few mm hypoattenuated foci within the liver are too small to be actually characterize, but likely represent cysts. There has been a prior cholecystectomy. There is bilateral renal cortical thinning.  The visualized portions of the pancreas and adrenal glands are within normal limits.  No acute osseous abnormalities are seen. Osteoarthritic changes at the thoracolumbar spine junction noted.  Review of the MIP images confirms the above findings.  IMPRESSION: No evidence of aortic dissection or aneurysmal dilation.  No evidence of large clinically significant pulmonary embolus.  Short segment of thickening of the distal esophagus, upstream from small hiatal hernia. Further evaluation with endoscopy results is recommended.  Mild atherosclerotic disease of the aorta and tortuosity of the thoracic aorta.  Right middle lobe atelectasis versus scarring.  Tiny hypoattenuated foci within the liver, not completely evaluated, however likely representing cysts by appearance.  Renal cortical thinning.   Electronically Signed   By: Ted Mcalpine M.D.   On: 03/24/2015 15:22    EKG:   Orders placed or performed during the hospital encounter of 03/24/15  . ED EKG within 10 minutes  . ED EKG within  10 minutes  . EKG 12-Lead  . EKG 12-Lead  . EKG 12-Lead  . EKG 12-Lead    IMPRESSION AND PLAN:   Suzanne Francis  is a 74 y.o. female with a known history of hypertension, non-insulin-dependent diabetes mellitus, sleep apnea, hypothyroidism, no prior cardiac disease comes to the emergency room secondary to chest pain.  1. Non-STEMI-admit to telemetry -Recycle troponins and CK-MB -Started on heparin drip. 2-D echogram ordered. Cardiology has been consulted. -Nitro paste, aspirin and metoprolol and statin started -A1c and lipid panel ordered  2. Hypertension-continue her lisinopril. Hydrochlorothiazide changed over to metoprolol for now  3. Diabetes mellitus-non-insulin-dependent. Hold metformin due to CT scan done today and likely patient might need cardiac catheterization. -A1c is ordered, started on sliding scale insulin. Also on glipizide and Actos  4. CK D-baseline creatinine seems to be around 1.3 - Creatinine is stable at this time. However patient did receive contrast for her CT scan and will likely need cardiac catheterization. -Gentle IV hydration and monitor creatinine  5. Obstructive sleep apnea-continue CPAP  6. DVT prophylaxis-on heparin drip  All the records are reviewed and case discussed with ED provider. Management plans discussed with the patient, family and they are in  agreement.  CODE STATUS: Full Code   TOTAL TIME TAKING CARE OF THIS PATIENT: 50 minutes.    KALISETTI,RADHIKA M.D on 03/24/2015 at 3:49 PM  Between 7am to 6pm - Pager - [phone]  After 6pm go to [email or web address] - password [email or web address] [facility]  [facility]  Office  [phone]  CC: Primary care physician; No primary care provider on file.

## 2015-03-24 NOTE — ED Notes (Signed)
Patient reports developing mid sternal chest pain during the night. Patient states she felt like it was indigestion, took some Tums with no relief. Patient reports N/V with vomiting x2. Denies any previous feelings similar to this. Denies any cardiac history.

## 2015-03-24 NOTE — ED Provider Notes (Signed)
[facility] Emergency Department [name] Note  ____________________________________________  Time seen: Approximately 1:30 PM  I have reviewed the triage vital signs and the nursing notes.   HISTORY  Chief Complaint Chest Pain    HPI Suzanne Francis is a 74 y.o. female History of hypertension, diabetes, thyroid disease presents for evaluation of intermittent substernal chest tightness radiating to her back since last night. This occurs at rest. It is worse with lying flat and not worsened with exertion. It is associated with shortness of breath as well as 2 episodes of nonbloody nonbilious emesis. The patient took 650 mg of aspirin this morning and due to worsening pain brought herself into the emergency department. Currently her pain is mild. No modifying factors. She has never had anything like this before. She has otherwise been in her usual state of health.   Past Medical History  Diagnosis Date  . Diabetes mellitus without complication   . Hypertension   . Thyroid disease     There are no active problems to display for this patient.   Past Surgical History  Procedure Laterality Date  . Cholecystectomy    . Tonsillectomy      Current Outpatient Rx  Name  Route  Sig  Dispense  Refill  . colchicine 0.6 MG tablet   Oral   Take 0.6-1.2 mg by mouth as needed (for gout flares).         Marland Kitchen escitalopram (LEXAPRO) 10 MG tablet   Oral   Take 10 mg by mouth daily.         Marland Kitchen glipiZIDE (GLUCOTROL XL) 10 MG 24 hr tablet   Oral   Take 10 mg by mouth daily.         . hydrochlorothiazide (HYDRODIURIL) 25 MG tablet   Oral   Take 25 mg by mouth daily.         Marland Kitchen levothyroxine (SYNTHROID, LEVOTHROID) 125 MCG tablet   Oral   Take 125 mcg by mouth daily before breakfast.         . lisinopril (PRINIVIL,ZESTRIL) 40 MG tablet   Oral   Take 40 mg by mouth daily.         . metFORMIN (GLUCOPHAGE) 500 MG tablet   Oral   Take 500 mg by mouth 2  (two) times daily with a meal.         . pioglitazone (ACTOS) 30 MG tablet   Oral   Take 30 mg by mouth daily.           Allergies Penicillins  No family history on file.  Social History Social History  Substance Use Topics  . Smoking status: Former Games developer  . Smokeless tobacco: None  . Alcohol Use: Yes     Comment: glass of wine    Review of Systems Constitutional: No fever/chills Eyes: No visual changes. ENT: No sore throat. Cardiovascular: + chest pain. Respiratory: + shortness of breath. Gastrointestinal: No abdominal pain.  No nausea, no vomiting.  No diarrhea.  No constipation. Genitourinary: Negative for dysuria. Musculoskeletal: Negative for back pain. Skin: Negative for rash. Neurological: Negative for headaches, focal weakness or numbness.  10-point ROS otherwise negative.  ____________________________________________   PHYSICAL EXAM:  VITAL SIGNS: ED Triage Vitals  Enc Vitals Group     BP 03/24/15 1151 155/89 mmHg     Pulse Rate 03/24/15 1151 69     Resp 03/24/15 1151 18     Temp 03/24/15 1151 97.5 F (36.4 C)  Temp Source 03/24/15 1151 Oral     SpO2 03/24/15 1151 98 %     Weight 03/24/15 1151 200 lb (90.719 kg)     Height 03/24/15 1151  (1.499 m)     Head Cir --      Peak Flow --      Pain Score 03/24/15 1151 5     Pain Loc --      Pain Edu? --      Excl. in GC? --     Constitutional: Alert and oriented. Well appearing and in no acute distress. Eyes: Conjunctivae are normal. PERRL. EOMI. Head: Atraumatic. Nose: No congestion/rhinnorhea. Mouth/Throat: Mucous membranes are moist.  Oropharynx non-erythematous. Neck: No stridor.   Cardiovascular: Normal rate, regular rhythm. Grossly normal heart sounds.  Good peripheral circulation. Respiratory: Normal respiratory effort.  No retractions. Lungs CTAB. Gastrointestinal: Soft and nontender. No distention. No abdominal bruits. No CVA tenderness. Genitourinary:deferred   Musculoskeletal: 1+ edema bilateral lower extremities. Neurologic:  Normal speech and language. No gross focal neurologic deficits are appreciated. No gait instability. Skin:  Skin is warm, dry and intact. No rash noted. Psychiatric: Mood and affect are normal. Speech and behavior are normal.  ____________________________________________   LABS (all labs ordered are listed, but only abnormal results are displayed)  Labs Reviewed  BASIC METABOLIC PANEL - Abnormal; Notable for the following:    Glucose, Bld 207 (*)    BUN 21 (*)    Creatinine, Ser 1.23 (*)    GFR calc non Af Amer 42 (*)    GFR calc Af Amer 49 (*)    All other components within normal limits  TROPONIN I - Abnormal; Notable for the following:    Troponin I 0.34 (*)    All other components within normal limits  CBC  PROTIME-INR  APTT   ____________________________________________  EKG  ED ECG REPORT I, Gayla Doss, the attending physician, personally viewed and interpreted this ECG.   Date: 03/24/2015  EKG Time: 11:56  Rate: 68  Rhythm: normal sinus rhythm  Axis: Normal  Intervals:none  ST&T Change: no acute ST elevation. T-wave inversions in 3, aVF. Q waves in lead aVF. Nonspecific T-wave flattening in V2, V3.  ____________________________________________  RADIOLOGY  CXR IMPRESSION: Mild patchy consolidation of medial right lung base, developing pneumonia is not excluded.  CT angio chest  IMPRESSION: No evidence of aortic dissection or aneurysmal dilation.  No evidence of large clinically significant pulmonary embolus.  Short segment of thickening of the distal esophagus, upstream from small hiatal hernia. Further evaluation with endoscopy results is recommended.  Mild atherosclerotic disease of the aorta and tortuosity of the thoracic aorta.  Right middle lobe atelectasis versus scarring.  Tiny hypoattenuated foci within the liver, not completely evaluated, however likely  representing cysts by appearance.  Renal cortical thinning.    ____________________________________________   PROCEDURES  Procedure(s) performed: None  Critical Care performed: Yes, see critical care note(s). Total critical care time spent 30 minutes.  ____________________________________________   INITIAL IMPRESSION / ASSESSMENT AND PLAN / ED COURSE  Pertinent labs & imaging results that were available during my care of the patient were reviewed by me and considered in my medical decision making (see chart for details).  Suzanne Francis is a 74 y.o. female History of hypertension, diabetes, thyroid disease presents for evaluation of intermittent substernal chest tightness radiating to her back since last night. On exam, she is generally well-appearing and in no acute distress but is complaining of mild  chest tightness. Vital signs stable, she is afebrile. EKG with T-wave inversions in inferior leads. Labs notable for mild BUN and creatinine elevation, will give light IV fluids. Creatinine is 1.23. Her troponin is elevated at 0.34 concerning for NSTEMI. She gave herself aspirin prior to arrival. [facility] treat her pain. Will not give nitroglycerin given EKG changes in inferior leads. Chest x-ray with mild patchy consolidation of the right lung base questioning develop a pneumonia however the patient has no infectious complaints. Awaiting CT anterior chest to rule out dissection given pain radiating to back then anticipate admission.  ----------------------------------------- 3:33 PM on 03/24/2015 -----------------------------------------  CTA chest negative for acute dissection, aneurysmal dilation or clinically significant PE. Patient with continued improvement in her pain however pain is not completely resolved at this time. We'll re-dose morphine, ordered heparin drip. Case discussed with Dr. Sylvan Cheese at this time for admission. ____________________________________________   FINAL  CLINICAL IMPRESSION(S) / ED DIAGNOSES  Final diagnoses:  Chest pain, unspecified chest pain type  NSTEMI (non-ST elevated myocardial infarction)      Gayla Doss, MD 03/24/15 1534

## 2015-03-24 NOTE — Progress Notes (Addendum)
ANTICOAGULATION CONSULT NOTE - Initial Consult  Pharmacy Consult for Heparin Indication: chest pain/ACS  Allergies  Allergen Reactions  . Penicillins Hives and Other (See Comments)    Has patient had a PCN reaction causing immediate rash, facial/tongue/throat swelling, SOB or lightheadedness with hypotension: No Has patient had a PCN reaction causing severe rash involving mucus membranes or skin necrosis: No Has patient had a PCN reaction that required hospitalization No Has patient had a PCN reaction occurring within the last 10 years: Yes If all of the above answers are "NO", then may proceed with Cephalosporin use.    Patient Measurements: Height:  (149.9 cm) Weight: 200 lb (90.719 kg) IBW/kg (Calculated) : 43.2 Heparin Dosing Weight: 65 kg   Vital Signs: Temp: 97.5 F (36.4 C) (09/20 1151) Temp Source: Oral (09/20 1151) BP: 159/75 mmHg (09/20 1455) Pulse Rate: 75 (09/20 1455)  Labs:  Recent Labs  03/24/15 1155  HGB 14.6  HCT 44.5  PLT 251  CREATININE 1.23*  TROPONINI 0.34*    Estimated Creatinine Clearance: 39.4 mL/min (by C-G formula based on Cr of 1.23).   Medical History: Past Medical History  Diagnosis Date  . Diabetes mellitus without complication   . Hypertension   . Thyroid disease     Medications:   (Not in a hospital admission)  Assessment: Pharmacy consulted to dose heparin in this 74 year old female admitted with ACS/NSTEMI.   CrCl = 39.4 ml/min No prior anticoagulation noted.  Goal of Therapy:  Heparin level 0.3-0.7 units/ml Monitor platelets by anticoagulation protocol: Yes   Plan:  Give 3900 units bolus x 1 Start heparin infusion at 800 units/hr  Will draw 1st HL 8 hrs after start of drip on 9/21 @ 00:30.  Will monitor H&H and platelets daily.   Robbins,Jason D 03/24/2015,3:46 PM

## 2015-03-24 NOTE — ED Notes (Signed)
Upon return from CT patient reported increased chest pain.

## 2015-03-24 NOTE — ED Notes (Signed)
ECHO Tech on the way to complete exam.

## 2015-03-24 NOTE — ED Notes (Signed)
Dr. Inocencio Homes notified of elevated Troponin (0.34). Charge aware to assign bed.

## 2015-03-25 ENCOUNTER — Encounter
Admission: EM | Disposition: A | Discharge status: Home / Self Care | Payer: Self-pay | Source: Home / Self Care | Attending: Internal Medicine

## 2015-03-25 DIAGNOSIS — I25110 Atherosclerotic heart disease of native coronary artery with unstable angina pectoris: Secondary | ICD-10-CM

## 2015-03-25 HISTORY — PX: CARDIAC CATHETERIZATION: SHX172

## 2015-03-25 LAB — CBC
HEMATOCRIT: 36.8 % (ref 35.0–47.0)
Hemoglobin: 12.1 g/dL (ref 12.0–16.0)
MCH: 31.5 pg (ref 26.0–34.0)
MCHC: 32.8 g/dL (ref 32.0–36.0)
MCV: 96.3 fL (ref 80.0–100.0)
Platelets: 221 10*3/uL (ref 150–440)
RBC: 3.83 MIL/uL (ref 3.80–5.20)
RDW: 12.8 % (ref 11.5–14.5)
WBC: 5.9 10*3/uL (ref 3.6–11.0)

## 2015-03-25 LAB — BASIC METABOLIC PANEL
Anion gap: 6 (ref 5–15)
BUN: 18 mg/dL (ref 6–20)
CHLORIDE: 106 mmol/L (ref 101–111)
CO2: 28 mmol/L (ref 22–32)
Calcium: 8.5 mg/dL — ABNORMAL LOW (ref 8.9–10.3)
Creatinine, Ser: 1.27 mg/dL — ABNORMAL HIGH (ref 0.44–1.00)
GFR calc Af Amer: 47 mL/min — ABNORMAL LOW (ref >60)
GFR calc non Af Amer: 41 mL/min — ABNORMAL LOW (ref >60)
GLUCOSE: 201 mg/dL — AB (ref 65–99)
POTASSIUM: 3.9 mmol/L (ref 3.5–5.1)
Sodium: 140 mmol/L (ref 135–145)

## 2015-03-25 LAB — GLUCOSE, CAPILLARY
GLUCOSE-CAPILLARY: 190 mg/dL — AB (ref 65–99)
GLUCOSE-CAPILLARY: 164 mg/dL — AB (ref 65–99)
Glucose-Capillary: 194 mg/dL — ABNORMAL HIGH (ref 65–99)
Glucose-Capillary: 116 mg/dL — ABNORMAL HIGH (ref 65–99)
Glucose-Capillary: 142 mg/dL — ABNORMAL HIGH (ref 65–99)

## 2015-03-25 LAB — HEPARIN LEVEL (UNFRACTIONATED)
HEPARIN UNFRACTIONATED: 0.46 [IU]/mL (ref 0.30–0.70)
Heparin Unfractionated: 0.28 IU/mL — ABNORMAL LOW (ref 0.30–0.70)

## 2015-03-25 LAB — TROPONIN I: TROPONIN I: 0.88 ng/mL — AB (ref <0.031)

## 2015-03-25 LAB — CKMB (ARMC ONLY): CK, MB: 10.7 ng/mL — ABNORMAL HIGH (ref 0.5–5.0)

## 2015-03-25 LAB — HEMOGLOBIN A1C: Hgb A1c MFr Bld: 7.7 % — ABNORMAL HIGH (ref 4.0–6.0)

## 2015-03-25 SURGERY — LEFT HEART CATH AND CORONARY ANGIOGRAPHY
Anesthesia: Moderate Sedation

## 2015-03-25 MED ORDER — MIDAZOLAM HCL 2 MG/2ML IJ SOLN
INTRAMUSCULAR | Status: AC
  Filled 2015-03-25: qty 2

## 2015-03-25 MED ORDER — ACETAMINOPHEN 325 MG PO TABS: 650.0000 mg | ORAL_TABLET | ORAL | Status: DC | PRN

## 2015-03-25 MED ORDER — BIVALIRUDIN 250 MG IV SOLR
INTRAVENOUS | Status: AC
  Filled 2015-03-25: qty 250

## 2015-03-25 MED ORDER — IOHEXOL 300 MG/ML  SOLN
INTRAMUSCULAR | Status: DC | PRN
  Administered 2015-03-25: 80 mL via INTRA_ARTERIAL

## 2015-03-25 MED ORDER — HYDRALAZINE HCL 20 MG/ML IJ SOLN
INTRAMUSCULAR | Status: DC | PRN
  Administered 2015-03-25: 10 mg via INTRAVENOUS

## 2015-03-25 MED ORDER — TICAGRELOR 90 MG PO TABS
ORAL_TABLET | ORAL | Status: AC
  Filled 2015-03-25: qty 2

## 2015-03-25 MED ORDER — BIVALIRUDIN BOLUS VIA INFUSION - CUPID
INTRAVENOUS | Status: DC | PRN
  Administered 2015-03-25: 69.3 mg via INTRAVENOUS

## 2015-03-25 MED ORDER — HEPARIN (PORCINE) IN NACL 2-0.9 UNIT/ML-% IJ SOLN
INTRAMUSCULAR | Status: AC
  Filled 2015-03-25: qty 1000

## 2015-03-25 MED ORDER — ASPIRIN 81 MG PO CHEW
81.0000 mg | CHEWABLE_TABLET | ORAL | Status: AC
  Administered 2015-03-25: 81 mg via ORAL

## 2015-03-25 MED ORDER — SODIUM CHLORIDE 0.9 % IJ SOLN: 3.0000 mL | INTRAMUSCULAR | Status: DC | PRN

## 2015-03-25 MED ORDER — ONDANSETRON HCL 4 MG/2ML IJ SOLN
4.0000 mg | Freq: Four times a day (QID) | INTRAMUSCULAR | Status: DC | PRN
  Administered 2015-03-25: 4 mg via INTRAVENOUS
  Filled 2015-03-25 (×2): qty 2

## 2015-03-25 MED ORDER — ASPIRIN EC 325 MG PO TBEC: 325.0000 mg | DELAYED_RELEASE_TABLET | Freq: Every day | ORAL | Status: DC

## 2015-03-25 MED ORDER — FENTANYL CITRATE (PF) 100 MCG/2ML IJ SOLN
INTRAMUSCULAR | Status: AC
  Filled 2015-03-25: qty 2

## 2015-03-25 MED ORDER — HYDRALAZINE HCL 20 MG/ML IJ SOLN
INTRAMUSCULAR | Status: AC
  Filled 2015-03-25: qty 1

## 2015-03-25 MED ORDER — NITROGLYCERIN 5 MG/ML IV SOLN
INTRAVENOUS | Status: AC
  Filled 2015-03-25: qty 10

## 2015-03-25 MED ORDER — GLIPIZIDE ER 2.5 MG PO TB24
5.0000 mg | ORAL_TABLET | Freq: Every day | ORAL | Status: DC
  Administered 2015-03-26 – 2015-03-27 (×2): 5 mg via ORAL
  Filled 2015-03-25 (×2): qty 2

## 2015-03-25 MED ORDER — SODIUM CHLORIDE 0.9 % IV BOLUS (SEPSIS)
500.0000 mL | Freq: Once | INTRAVENOUS | Status: AC
  Administered 2015-03-25: 500 mL via INTRAVENOUS

## 2015-03-25 MED ORDER — ONDANSETRON HCL 4 MG/2ML IJ SOLN
INTRAMUSCULAR | Status: DC | PRN
  Administered 2015-03-25: 4 mg via INTRAVENOUS

## 2015-03-25 MED ORDER — TICAGRELOR 90 MG PO TABS
90.0000 mg | ORAL_TABLET | Freq: Two times a day (BID) | ORAL | Status: DC
  Administered 2015-03-25 – 2015-03-27 (×5): 90 mg via ORAL
  Filled 2015-03-25 (×4): qty 1

## 2015-03-25 MED ORDER — ONDANSETRON HCL 4 MG/2ML IJ SOLN
INTRAMUSCULAR | Status: AC
  Filled 2015-03-25: qty 2

## 2015-03-25 MED ORDER — ASPIRIN 325 MG PO TABS: 325.0000 mg | ORAL_TABLET | Freq: Every day | ORAL | Status: DC

## 2015-03-25 MED ORDER — MORPHINE SULFATE (PF) 2 MG/ML IV SOLN
INTRAVENOUS | Status: AC
  Filled 2015-03-25: qty 1

## 2015-03-25 MED ORDER — SODIUM CHLORIDE 0.9 % IV SOLN: 250.0000 mL | INTRAVENOUS | Status: DC | PRN

## 2015-03-25 MED ORDER — SODIUM CHLORIDE 0.9 % IJ SOLN
3.0000 mL | Freq: Two times a day (BID) | INTRAMUSCULAR | Status: DC
  Administered 2015-03-26 – 2015-03-27 (×3): 3 mL via INTRAVENOUS

## 2015-03-25 MED ORDER — SODIUM CHLORIDE 0.9 % WEIGHT BASED INFUSION: 1.0000 mL/kg/h | INTRAVENOUS | Status: DC

## 2015-03-25 MED ORDER — SODIUM CHLORIDE 0.9 % IJ SOLN: 3.0000 mL | Freq: Two times a day (BID) | INTRAMUSCULAR | Status: DC

## 2015-03-25 MED ORDER — HEPARIN BOLUS VIA INFUSION
1000.0000 [IU] | Freq: Once | INTRAVENOUS | Status: AC
  Administered 2015-03-25: 1000 [IU] via INTRAVENOUS
  Filled 2015-03-25: qty 1000

## 2015-03-25 MED ORDER — ASPIRIN 81 MG PO CHEW
CHEWABLE_TABLET | ORAL | Status: AC
  Filled 2015-03-25: qty 4

## 2015-03-25 MED ORDER — ONDANSETRON HCL 4 MG/2ML IJ SOLN: 4.0000 mg | INTRAMUSCULAR | Status: DC | PRN

## 2015-03-25 MED ORDER — ASPIRIN 81 MG PO CHEW
CHEWABLE_TABLET | ORAL | Status: AC
  Filled 2015-03-25: qty 1

## 2015-03-25 MED ORDER — IOHEXOL 300 MG/ML  SOLN
INTRAMUSCULAR | Status: DC | PRN
  Administered 2015-03-25: 90 mL via INTRA_ARTERIAL

## 2015-03-25 MED ORDER — SODIUM CHLORIDE 0.9 % IV SOLN
250.0000 mg | INTRAVENOUS | Status: DC | PRN
Start: 2015-03-25 — End: 2015-03-25
  Administered 2015-03-25: 1.75 mg/kg/h via INTRAVENOUS

## 2015-03-25 MED ORDER — FENTANYL CITRATE (PF) 100 MCG/2ML IJ SOLN
INTRAMUSCULAR | Status: DC | PRN
  Administered 2015-03-25: 50 ug via INTRAVENOUS

## 2015-03-25 MED ORDER — PROMETHAZINE HCL 25 MG/ML IJ SOLN
12.5000 mg | Freq: Once | INTRAMUSCULAR | Status: AC
  Administered 2015-03-25: 12.5 mg via INTRAVENOUS
  Filled 2015-03-25: qty 1

## 2015-03-25 MED ORDER — NITROGLYCERIN 0.4 MG SL SUBL
SUBLINGUAL_TABLET | SUBLINGUAL | Status: DC | PRN
  Administered 2015-03-25: .4 mg via SUBLINGUAL

## 2015-03-25 MED ORDER — SODIUM CHLORIDE 0.9 % WEIGHT BASED INFUSION: 3.0000 mL/kg/h | INTRAVENOUS | Status: DC

## 2015-03-25 MED ORDER — SODIUM CHLORIDE 0.9 % IJ SOLN
3.0000 mL | INTRAMUSCULAR | Status: DC | PRN
Start: 2015-03-25 — End: 2015-03-25

## 2015-03-25 MED ORDER — MIDAZOLAM HCL 2 MG/2ML IJ SOLN
INTRAMUSCULAR | Status: DC | PRN
  Administered 2015-03-25: 1 mg via INTRAVENOUS

## 2015-03-25 MED ORDER — NITROGLYCERIN 0.4 MG SL SUBL
0.4000 mg | SUBLINGUAL_TABLET | SUBLINGUAL | Status: DC | PRN
  Administered 2015-03-25: 0.4 mg via SUBLINGUAL

## 2015-03-25 MED ORDER — ASPIRIN 81 MG PO CHEW
324.0000 mg | CHEWABLE_TABLET | Freq: Once | ORAL | Status: AC
  Administered 2015-03-25: 324 mg via ORAL

## 2015-03-25 MED ORDER — NITROGLYCERIN 0.4 MG SL SUBL
SUBLINGUAL_TABLET | SUBLINGUAL | Status: AC
  Filled 2015-03-25: qty 1

## 2015-03-25 MED ORDER — SODIUM CHLORIDE 0.9 % WEIGHT BASED INFUSION
3.0000 mL/kg/h | INTRAVENOUS | Status: DC
  Administered 2015-03-25: 3 mL/kg/h via INTRAVENOUS

## 2015-03-25 MED ORDER — ADENOSINE (DIAGNOSTIC) 3 MG/ML IV SOLN
INTRAVENOUS | Status: AC
  Filled 2015-03-25: qty 30

## 2015-03-25 SURGICAL SUPPLY — 18 items
BALLN TREK RX 2.25X15 (BALLOONS) ×3
BALLN ~~LOC~~ TREK RX 2.5X12 (BALLOONS) ×3
BALLOON TREK RX 2.25X15 (BALLOONS) ×1 IMPLANT
BALLOON ~~LOC~~ TREK RX 2.5X12 (BALLOONS) ×1 IMPLANT
CATH INFINITI 5FR ANG PIGTAIL (CATHETERS) ×3 IMPLANT
CATH INFINITI 5FR JL4 (CATHETERS) ×3 IMPLANT
CATH INFINITI JR4 5F (CATHETERS) ×3 IMPLANT
CATH VISTA GUIDE 6FR XBLAD3.5 (CATHETERS) ×3 IMPLANT
DEVICE CLOSURE MYNXGRIP 6/7F (Vascular Products) ×3 IMPLANT
DEVICE INFLAT 30 PLUS (MISCELLANEOUS) ×3 IMPLANT
KIT MANI 3VAL PERCEP (MISCELLANEOUS) ×3 IMPLANT
NEEDLE PERC 18GX7CM (NEEDLE) ×3 IMPLANT
PACK CARDIAC CATH (CUSTOM PROCEDURE TRAY) ×3 IMPLANT
SHEATH AVANTI 5FR X 11CM (SHEATH) ×3 IMPLANT
SHEATH AVANTI 6FR X 11CM (SHEATH) ×3 IMPLANT
STENT XIENCE ALPINE RX 2.25X18 (Permanent Stent) ×3 IMPLANT
WIRE EMERALD 3MM-J .035X150CM (WIRE) ×3 IMPLANT
WIRE G HI TQ BMW 190 (WIRE) ×3 IMPLANT

## 2015-03-25 NOTE — OR Nursing (Signed)
Reports 0/10 chest pain, however still feel nauseated, after one more baby asa and sip of gingerale

## 2015-03-25 NOTE — Progress Notes (Signed)
Cardiology Note  Underwent cardiac cath revealing 90% ramus intermedius with timi 2 flow felt to be IRV>  90% ostial small d1 not amenable to pci due to normal lad. Left circ om1 lesion appeard non critical. Underwent pci of RI.  Xience 2.25 x 18 DES>  Will continue with angiomax and treat long term with asa and brilinta.

## 2015-03-25 NOTE — Care Management (Signed)
Writer to room for assessment.  Patient still off the floor.  Will follow up tomorrow

## 2015-03-25 NOTE — Consult Note (Signed)
[facility] CLINIC CARDIOLOGY [facility]  CARDIOLOGY CONSULT NOTE  Patient ID: NASHA DISS MRN: [id number] DOB/AGE: 74/10/1940 74 y.o.  Admit date: 03/24/2015 Referring Physician Graciela Husbands Primary Physician [facility] Primary Cardiologist Tashe Purdon Reason for Consultation nstemi  HPI: 74 yo female with history of hypertension, dm, and chest pain with both typical and atypical features. She complains of mid sternal chest pain with radiation to her back and arms. She has a mild troponin elevation to 0.8. EKG did not reveal injury current. Hemodynamically stable. Currently still has mid sternal chest heaviness.   ROS Review of Systems - General ROS: positive for  - chest pain with nausea and diapharesis Respiratory ROS: positive for - cough Cardiovascular ROS: positive for - chest pain Gastrointestinal ROS: no abdominal pain, change in bowel habits, or black or bloody stools Neurological ROS: no TIA or stroke symptoms   Past Medical History  Diagnosis Date  . Diabetes mellitus without complication   . Hypertension   . Thyroid disease     hypothyroidism  . OSA (obstructive sleep apnea)   . Vitamin D deficiency   . GERD (gastroesophageal reflux disease)   . CKD (chronic kidney disease)     Family History  Problem Relation Age of Onset  . CVA Father   . CAD Mother     Social History   Social History  . Marital Status: Widowed    Spouse Name: N/A  . Number of Children: N/A  . Years of Education: N/A   Occupational History  . Not on file.   Social History Main Topics  . Smoking status: Former Smoker    Quit date: 03/24/1975  . Smokeless tobacco: Not on file  . Alcohol Use: 0.0 oz/week    0 Standard drinks or equivalent per week     Comment: glass of wine occasionally  . Drug Use: No  . Sexual Activity: Not on file   Other Topics Concern  . Not on file   Social History Narrative   Lives at home by herself.    Past Surgical History  Procedure Laterality Date  .  Cholecystectomy    . Tonsillectomy       Prescriptions prior to admission  Medication Sig Dispense Refill Last Dose  . colchicine 0.6 MG tablet Take 0.6-1.2 mg by mouth as needed (for gout flares).   PRN at PRN  . escitalopram (LEXAPRO) 10 MG tablet Take 10 mg by mouth daily.   03/24/2015 at Unknown time  . glipiZIDE (GLUCOTROL XL) 10 MG 24 hr tablet Take 10 mg by mouth daily.   03/24/2015 at Unknown time  . hydrochlorothiazide (HYDRODIURIL) 25 MG tablet Take 25 mg by mouth daily.   03/24/2015 at Unknown time  . levothyroxine (SYNTHROID, LEVOTHROID) 125 MCG tablet Take 125 mcg by mouth daily before breakfast.   03/24/2015 at Unknown time  . lisinopril (PRINIVIL,ZESTRIL) 40 MG tablet Take 40 mg by mouth daily.   03/24/2015 at Unknown time  . metFORMIN (GLUCOPHAGE) 500 MG tablet Take 500 mg by mouth 2 (two) times daily with a meal.   03/24/2015 at Unknown time  . pioglitazone (ACTOS) 30 MG tablet Take 30 mg by mouth daily.   03/24/2015 at Unknown time    Physical Exam: Blood pressure 117/55, pulse 82, temperature 98.1 F (36.7 C), temperature source Oral, resp. rate 18, height  (1.499 m), weight 92.443 kg (203 lb 12.8 oz), SpO2 92 %.   General appearance: alert and cooperative Resp: clear to auscultation bilaterally  Cardio: regular rate and rhythm GI: soft, non-tender; bowel sounds normal; no masses,  no organomegaly Pulses: 2+ and symmetric Neurologic: Grossly normal Labs:   Lab Results  Component Value Date   WBC 5.9 03/25/2015   HGB 12.1 03/25/2015   HCT 36.8 03/25/2015   MCV 96.3 03/25/2015   PLT 221 03/25/2015    Recent Labs Lab 03/25/15 0422  NA 140  K 3.9  CL 106  CO2 28  BUN 18  CREATININE 1.27*  CALCIUM 8.5*  GLUCOSE 201*   Lab Results  Component Value Date   TROPONINI 0.88* 03/25/2015      Radiology: CXR revealed mild patchy consolidation of right medial lung base. Chest CT revealed no pulmonary embolus.  EKG: NSR with no ischemia  ASSESSMENT AND PLAN:   74 yo female withi history of dm, hypertension who was admitted with midsternal chest pain with radiation to the back with diapharesis, nausea and elevated serum tropoinin. WIll need left cardiac cath to evaluate anatomy in patient with chest pain, abnormal troponin and continued rest discomfort . Risk and benefits of cath explained. Further recs after cath.  Signed: Dalia Heading MD, [facility] 03/25/2015, 8:37 AM

## 2015-03-25 NOTE — Progress Notes (Signed)
Per MD Fath, give 500 cc bolus and stop nitro patch

## 2015-03-25 NOTE — Progress Notes (Signed)
Patient ID: Suzanne Francis, female   DOB: 05-16-1941, 74 y.o.   MRN: [id number] SUBJECTIVE:  Admitted with SSCP and N/V; found to have elevated troponins.  CT A negative.  NO further CP overnight; no further N/V.  Minimal h/a this AM, better now.    ______________________________________________________________________  ROS: Please see HPI; remainder of complete 10 point ROS is negative   Past Medical History  Diagnosis Date  . Diabetes mellitus without complication   . Hypertension   . Thyroid disease     hypothyroidism  . OSA (obstructive sleep apnea)   . Vitamin D deficiency   . GERD (gastroesophageal reflux disease)   . CKD (chronic kidney disease)     Past Surgical History  Procedure Laterality Date  . Cholecystectomy    . Tonsillectomy       Current facility-administered medications:  .  0.9 %  sodium chloride infusion, , Intravenous, Continuous, Enid Baas, MD, Last Rate: 100 mL/hr at 03/24/15 1840 .  acetaminophen (TYLENOL) tablet 650 mg, 650 mg, Oral, Q6H PRN **OR** acetaminophen (TYLENOL) suppository 650 mg, 650 mg, Rectal, Q6H PRN, Enid Baas, MD .  aspirin tablet 325 mg, 325 mg, Oral, Daily, Curtis Sites III, MD .  escitalopram (LEXAPRO) tablet 10 mg, 10 mg, Oral, Daily, Enid Baas, MD, 10 mg at 03/24/15 1815 .  glipiZIDE (GLUCOTROL XL) 24 hr tablet 5 mg, 5 mg, Oral, Daily, Curtis Sites III, MD, 5 mg at 03/25/15 1000 .  heparin ADULT infusion 100 units/mL (25000 units/250 mL), 950 Units/hr, Intravenous, Continuous, Gayla Doss, MD, Last Rate: 9.5 mL/hr at 03/25/15 0316, 950 Units/hr at 03/25/15 0316 .  insulin aspart (novoLOG) injection 0-5 Units, 0-5 Units, Subcutaneous, QHS, Enid Baas, MD, 0 Units at 03/24/15 2200 .  insulin aspart (novoLOG) injection 0-9 Units, 0-9 Units, Subcutaneous, TID WC, Enid Baas, MD, 0 Units at 03/25/15 0800 .  levothyroxine (SYNTHROID, LEVOTHROID) tablet 125 mcg, 125 mcg, Oral, QAC breakfast,  Enid Baas, MD, 125 mcg at 03/25/15 0800 .  lisinopril (PRINIVIL,ZESTRIL) tablet 40 mg, 40 mg, Oral, Daily, Enid Baas, MD .  metoprolol tartrate (LOPRESSOR) tablet 25 mg, 25 mg, Oral, BID, Enid Baas, MD, 25 mg at 03/24/15 1815 .  morphine 2 MG/ML injection 2 mg, 2 mg, Intravenous, Q4H PRN, Enid Baas, MD, 2 mg at 03/24/15 2130 .  nitroGLYCERIN (NITROGLYN) 2 % ointment 1 inch, 1 inch, Topical, 4 times per day, Enid Baas, MD, 1 inch at 03/25/15 0526 .  oxyCODONE (Oxy IR/ROXICODONE) immediate release tablet 5 mg, 5 mg, Oral, Q4H PRN, Enid Baas, MD, 5 mg at 03/25/15 0036 .  pioglitazone (ACTOS) tablet 30 mg, 30 mg, Oral, Daily, Enid Baas, MD .  rosuvastatin (CRESTOR) tablet 20 mg, 20 mg, Oral, q1800, Enid Baas, MD .  sodium chloride 0.9 % injection 3 mL, 3 mL, Intravenous, Q12H, Enid Baas, MD, 3 mL at 03/24/15 2200  PHYSICAL EXAM:  BP 117/55 mmHg  Pulse 82  Temp(Src) 98.1 F (36.7 C) (Oral)  Resp 18  Ht  (1.499 m)  Wt 92.443 kg (203 lb 12.8 oz)  BMI 41.14 kg/m2  SpO2 92%  General: Well developed, well nourished female, in NAD HEENT: PERRL; OP dry without lesions. Neck: supple, trachea midline, no thyromegaly Chest: normal to palpation Lungs: clear bilaterally without retractions or wheezes Cardiovascular: RRR, no murmur, no gallop; distal pulses 2+ Abdomen: soft, nontender, nondistended, positive bowel sounds Extremities: no clubbing, cyanosis, edema Neuro: alert and oriented, moves all extremities Derm: no significant  rashes or nodules; good skin turgor Lymph: no cervical or supraclavicular lymphadenopathy  Labs and imaging studies were reviewed  ASSESSMENT/PLAN:   1. NSTEMI, acute- Discussed with cardiology, who will see this AM.  Most likely will need cath to further diagnose and treat.  Hold NPO.  Cont ASA, heparin, statin 2. DM- reduce standing dose meds; cover with SSI 3. OSA-cont CPAP when  sleeping 4. HTN- controlled.

## 2015-03-25 NOTE — Progress Notes (Signed)
ANTICOAGULATION CONSULT NOTE - Initial Consult  Pharmacy Consult for Heparin Indication: chest pain/ACS  Allergies  Allergen Reactions  . Penicillins Hives and Other (See Comments)    Has patient had a PCN reaction causing immediate rash, facial/tongue/throat swelling, SOB or lightheadedness with hypotension: No Has patient had a PCN reaction causing severe rash involving mucus membranes or skin necrosis: No Has patient had a PCN reaction that required hospitalization No Has patient had a PCN reaction occurring within the last 10 years: Yes If all of the above answers are "NO", then may proceed with Cephalosporin use.    Patient Measurements: Height:  (149.9 cm) Weight: 203 lb 12.8 oz (92.443 kg) IBW/kg (Calculated) : 43.2 Heparin Dosing Weight: 65 kg   Vital Signs: Temp: 98.1 F (36.7 C) (09/20 1936) Temp Source: Oral (09/20 1936) BP: 124/53 mmHg (09/20 2356) Pulse Rate: 79 (09/20 2356)  Labs:  Recent Labs  03/24/15 1155 03/24/15 1620 03/24/15 2146 03/25/15 0132  HGB 14.6  --   --   --   HCT 44.5  --   --   --   PLT 251  --   --   --   APTT 29  --   --   --   LABPROT 12.7  --   --   --   INR 0.93  --   --   --   HEPARINUNFRC  --   --   --  0.28*  CREATININE 1.23*  --   --   --   TROPONINI 0.34* 0.47* 0.62*  --     Estimated Creatinine Clearance: 39.8 mL/min (by C-G formula based on Cr of 1.23).   Medical History: Past Medical History  Diagnosis Date  . Diabetes mellitus without complication   . Hypertension   . Thyroid disease     hypothyroidism  . OSA (obstructive sleep apnea)   . Vitamin D deficiency   . GERD (gastroesophageal reflux disease)   . CKD (chronic kidney disease)     Medications:  Prescriptions prior to admission  Medication Sig Dispense Refill Last Dose  . colchicine 0.6 MG tablet Take 0.6-1.2 mg by mouth as needed (for gout flares).   PRN at PRN  . escitalopram (LEXAPRO) 10 MG tablet Take 10 mg by mouth daily.   03/24/2015 at  Unknown time  . glipiZIDE (GLUCOTROL XL) 10 MG 24 hr tablet Take 10 mg by mouth daily.   03/24/2015 at Unknown time  . hydrochlorothiazide (HYDRODIURIL) 25 MG tablet Take 25 mg by mouth daily.   03/24/2015 at Unknown time  . levothyroxine (SYNTHROID, LEVOTHROID) 125 MCG tablet Take 125 mcg by mouth daily before breakfast.   03/24/2015 at Unknown time  . lisinopril (PRINIVIL,ZESTRIL) 40 MG tablet Take 40 mg by mouth daily.   03/24/2015 at Unknown time  . metFORMIN (GLUCOPHAGE) 500 MG tablet Take 500 mg by mouth 2 (two) times daily with a meal.   03/24/2015 at Unknown time  . pioglitazone (ACTOS) 30 MG tablet Take 30 mg by mouth daily.   03/24/2015 at Unknown time    Assessment: Pharmacy consulted to dose heparin in this 74 year old female admitted with ACS/NSTEMI.   CrCl = 39.4 ml/min No prior anticoagulation noted.  Goal of Therapy:  Heparin level 0.3-0.7 units/ml Monitor platelets by anticoagulation protocol: Yes   Plan:  Give 3900 units bolus x 1 Start heparin infusion at 800 units/hr  Will draw 1st HL 8 hrs after start of drip on 9/21 @ 00:30.  Will monitor H&H and platelets daily.   9/21 02:00 anti-Xa 0.28. 1000 unit bolus and increase to 950 units/hr. Recheck in 8 hours.  Heyli Min S 03/25/2015,3:06 AM

## 2015-03-25 NOTE — Procedures (Signed)
enterred in error

## 2015-03-25 NOTE — OR Nursing (Signed)
Awakened upon groin check, "I HATE TO COMPLAIN" 4/10 chest pain . Nitro SL given along with morphine 2 mg iv push..The current medical regimen is effective;  continue present plan and medications. To monitor

## 2015-03-26 ENCOUNTER — Inpatient Hospital Stay: Payer: Medicare Other

## 2015-03-26 ENCOUNTER — Encounter: Payer: Self-pay | Admitting: Cardiology

## 2015-03-26 LAB — CBC
HEMATOCRIT: 34.5 % — AB (ref 35.0–47.0)
HEMOGLOBIN: 11.8 g/dL — AB (ref 12.0–16.0)
MCH: 33.2 pg (ref 26.0–34.0)
MCHC: 34.3 g/dL (ref 32.0–36.0)
MCV: 96.6 fL (ref 80.0–100.0)
Platelets: 196 10*3/uL (ref 150–440)
RBC: 3.57 MIL/uL — AB (ref 3.80–5.20)
RDW: 12.8 % (ref 11.5–14.5)
WBC: 6.8 10*3/uL (ref 3.6–11.0)

## 2015-03-26 LAB — BASIC METABOLIC PANEL
Anion gap: 6 (ref 5–15)
BUN: 16 mg/dL (ref 6–20)
CHLORIDE: 110 mmol/L (ref 101–111)
CO2: 27 mmol/L (ref 22–32)
Calcium: 8.5 mg/dL — ABNORMAL LOW (ref 8.9–10.3)
Creatinine, Ser: 1.26 mg/dL — ABNORMAL HIGH (ref 0.44–1.00)
GFR calc Af Amer: 47 mL/min — ABNORMAL LOW (ref >60)
GFR calc non Af Amer: 41 mL/min — ABNORMAL LOW (ref >60)
GLUCOSE: 154 mg/dL — AB (ref 65–99)
POTASSIUM: 3.7 mmol/L (ref 3.5–5.1)
Sodium: 143 mmol/L (ref 135–145)

## 2015-03-26 LAB — CK ISOENZYMES
CK MB: 0 % (ref 0–3)
CK-BB: 0 %
CK-MM: 100 % (ref 97–100)
Creatine Kinase-Total: 66 U/L (ref 24–173)
MACRO TYPE 2: 0 %
Macro Type 1: 0 %

## 2015-03-26 LAB — GLUCOSE, CAPILLARY
Glucose-Capillary: 156 mg/dL — ABNORMAL HIGH (ref 65–99)
Glucose-Capillary: 137 mg/dL — ABNORMAL HIGH (ref 65–99)
Glucose-Capillary: 119 mg/dL — ABNORMAL HIGH (ref 65–99)
Glucose-Capillary: 140 mg/dL — ABNORMAL HIGH (ref 65–99)

## 2015-03-26 MED ORDER — ASPIRIN EC 81 MG PO TBEC
81.0000 mg | DELAYED_RELEASE_TABLET | Freq: Every day | ORAL | Status: DC
  Administered 2015-03-26 – 2015-03-27 (×2): 81 mg via ORAL
  Filled 2015-03-26 (×2): qty 1

## 2015-03-26 NOTE — Progress Notes (Signed)
[facility]  SUBJECTIVE: s/p nstemi and pci of ramus intermedius with drug eluting stent. No further chest pain. Nausea and vomiting have improved. Cath site clean and dry.    Filed Vitals:   03/25/15 1839 03/25/15 1937 03/25/15 2210 03/26/15 0622  BP: 124/50 117/44 133/49 153/73  Pulse: 87 84 91 76  Temp:  98.6 F (37 C)  98.6 F (37 C)  TempSrc:  Oral    Resp:  19  20  Height:      Weight:      SpO2:  100%  93%    Intake/Output Summary (Last 24 hours) at 03/26/15 0838 Last data filed at 03/25/15 1800  Gross per 24 hour  Intake 1030.26 ml  Output    350 ml  Net 680.26 ml    LABS: Basic Metabolic Panel:  Recent Labs  13/08/65 0422 03/26/15 0410  NA 140 143  K 3.9 3.7  CL 106 110  CO2 28 27  GLUCOSE 201* 154*  BUN 18 16  CREATININE 1.27* 1.26*  CALCIUM 8.5* 8.5*   Liver Function Tests: No results for input(s): AST, ALT, ALKPHOS, BILITOT, PROT, ALBUMIN in the last 72 hours. No results for input(s): LIPASE, AMYLASE in the last 72 hours. CBC:  Recent Labs  03/25/15 0422 03/26/15 0410  WBC 5.9 6.8  HGB 12.1 11.8*  HCT 36.8 34.5*  MCV 96.3 96.6  PLT 221 196   Cardiac Enzymes:  Recent Labs  03/24/15 1620 03/24/15 2146 03/25/15 0422 03/25/15 1840  CKMB 0  --   --  10.7*  TROPONINI 0.47* 0.62* 0.88*  --    BNP: Invalid input(s): POCBNP D-Dimer: No results for input(s): DDIMER in the last 72 hours. Hemoglobin A1C:  Recent Labs  03/24/15 2146  HGBA1C 7.7*   Fasting Lipid Panel:  Recent Labs  03/24/15 2146  CHOL 188  HDL 43  LDLCALC 104*  TRIG 204*  CHOLHDL 4.4   Thyroid Function Tests: No results for input(s): TSH, T4TOTAL, T3FREE, THYROIDAB in the last 72 hours.  Invalid input(s): FREET3 Anemia Panel: No results for input(s): VITAMINB12, FOLATE, FERRITIN, TIBC, IRON, RETICCTPCT in the last 72 hours.   Physical Exam: Blood pressure 153/73, pulse 76, temperature 98.6 F (37 C), temperature  source Oral, resp. rate 20, height  (1.499 m), weight 92.443 kg (203 lb 12.8 oz), SpO2 93 %.   General appearance: alert and cooperative Resp: clear to auscultation bilaterally Cardio: regular rate and rhythm, S1, S2 normal, no murmur, click, rub or gallop GI: soft, non-tender; bowel sounds normal; no masses,  no organomegaly Extremities: extremities normal, atraumatic, no cyanosis or edema and cath site clean and dry with no hematoma or bruit. Distal pulses intact Neurologic: Grossly normal  TELEMETRY: Reviewed telemetry pt in nsr:  ASSESSMENT AND PLAN:  Active Problems:   NSTEMI (non-ST elevated myocardial infarction)-s/p nstemi. Infarct related vessel felt to be ramus intermedius after cardiac cath revealed 90% stenosis with timi 2 flow. Received xience 2.25/18 mm des. Moderate disease in om2 and d1 felt to be candidate to treat medically. Will conitnue with asa 81 mg daily; brilinta 90 mg bid, crestor 20 mg daily for high intensity statin and metoprolol 25 bid and lisinopril 40 daily. Ambulate this am and discharge to home if stable with outpatintient follow up in 1 week.    Coronary artery disease involving native coronary artery of native heart with unstable angina pectoris    Dalia Heading., MD, [facility] 03/26/2015 8:38 AM

## 2015-03-26 NOTE — Care Management Note (Addendum)
Case Management Note  Patient Details  Name: Suzanne Francis MRN: [id number] Date of Birth: [birth year]  Subjective/Objective:                 Patient presents from home with NSTEMI.  Patient states that currently her son is living with her.  Patients states that she obtains her medications [facility] on [address].  Patient states that she does not have any trouble obtaining her medication, and is able to drive herself when needed.  Patient states that she does not have any home equipment, and does not have any concerns about returning home.  No needs anticipate.  Will follow for discharge planning.     Action/Plan:  Plan for patient to be discharged on Birlinta.  With patient's request called her pharmacy on [address], and cost of medication was $125 a month.  Patient states that this is not something that she will be able to financially afford.  With patients permission I called her medication coverage insurance.  Insurance rep states that if the patient uses a preferred pharmacy the cost will be $60 for 3 months.  [facility] include [facility] in [facility], [facility], and [facility].  Patient wishes to obtain from [facility] on [address].  Patient provided with a free 30 trail card.   Expected Discharge Date:                  Expected Discharge Plan:     In-House Referral:     Discharge planning Services     Post Acute Care Choice:    Choice offered to:     DME Arranged:    DME Agency:     HH Arranged:    HH Agency:     Status of Service:     Medicare Important Message Given:  Yes-second notification given Date Medicare IM Given:    Medicare IM give by:    Date Additional Medicare IM Given:    Additional Medicare Important Message give by:     If discussed at Long Length of Stay Meetings, dates discussed:    Additional Comments:  Chapman Fitch, RN 03/26/2015, 10:26 AM

## 2015-03-26 NOTE — Progress Notes (Signed)
Inpatient Diabetes Program Recommendations  AACE/ADA: New Consensus Statement on Inpatient Glycemic Control (2015)  Target Ranges:  Prepandial:   less than 140 mg/dL      Peak postprandial:   less than 180 mg/dL (1-2 hours)      Critically ill patients:  140 - 180 mg/dL   Review of Glycemic Control  Agree with current orders for diabetes medications; Novolog 0-9 units tid, Novolog 0-5 units qhs, Actos  qday, Glipizide /day.     A1C 7.7%  Susette Racer, RN, [place], [place], CDE Diabetes Coordinator Inpatient Diabetes Program  [phone] (Team Pager) [phone] [facility] Office) 03/26/2015 12:46 PM

## 2015-03-26 NOTE — Progress Notes (Signed)
Patient ID: Suzanne Francis, female   DOB: [birth year], 74 y.o.   MRN: [id number]  Pt ambulated and noted to have significant hypoxia; pt clinically with mild dyspnea, which she admits may not be much different than she's felt at home.  Sats into 70's, improving to >90% with oxygen.  No history of sig lung disease, except remote pneumonia and h/o OSA.  Recent CT angio showed no PE and no major structural issues.  Will consult pulmonology; will try to get PFT's, DLCO today as well.  Discussed with pt that she meets criteria for home oxygen currently, though etiology of her profound hypoxia is not clear.

## 2015-03-26 NOTE — Progress Notes (Signed)
Dr Graciela Husbands paged and made aware of ambulatory sat and O2 requirement. MD reports he will see patient

## 2015-03-26 NOTE — Progress Notes (Signed)
SATURATION QUALIFICATIONS: (This note is used to comply with regulatory documentation for home oxygen)  Patient Saturations on Room Air at Rest = 90%  Patient Saturations on Room Air while Ambulating = 78%  Patient Saturations on 3 Liters of oxygen while Ambulating = 92%  Please briefly explain why patient needs home oxygen:

## 2015-03-26 NOTE — Progress Notes (Signed)
Aspirin dosing limit with Ticagrelor  Patient is a 74 yo female with orders for ticagrelor (Brilinta) 90 mg po BID and Aspirin EC 325 mg po daily.    Maintenance doses of aspirin greater than 100 mg/day reduce the efficacy of ticagrelor and should be avoided. Use of higher maintenance doses of aspirin (ie, >100 mg/day) was associated with relatively unfavorable outcomes for ticagrelor versus clopidogrel in the PLATO trial Blima Ledger, 2011; [place], 2009).  Per automatic dosage limit policy, will transition patient to aspirin EC 81 mg po daily.  Clarisa Schools, PharmD Clinical Pharmacist 03/26/2015

## 2015-03-26 NOTE — Progress Notes (Addendum)
Date: 03/26/2015,   MRN# [id number] Suzanne Francis 1940/08/31 Code Status:     Code Status Orders        Start     Ordered   03/25/15 1350  Full code   Continuous     03/25/15 1352    Advance Directive Documentation        Most Recent Value   Type of Advance Directive  Healthcare Power of Attorney   Pre-existing out of facility DNR order (yellow form or pink MOST form)     "MOST" Form in Place?       Hosp day:@LENGTHOFSTAYDAYS @ Referring MD: @        AdmissionWeight: 200 lb (90.719 kg)                 CurrentWeight: 203 lb 12.8 oz (92.443 kg)   CC: OXYGEN DESATURATION  HPI: This is a 74 year old lady  known to have the  history of hypertension, non-insulin-dependent diabetes mellitus, obstructive sleep apnea (not wearing her cpap), hypothyroidism who  comes to the emergency room secondary to chest pain, elevated troponin level and shortness of breath. Status post NSTEMI. She was  seen by cardiology, s/p left cath. PCI of the ramus intermedius with drug eluting stent. One critical lesion was unable to be stented. Today on standing her sats dropped in the 70;s, oxygen given sats came back up. Hence pulmonary was consulted. Presently she is on 3 liters. Less sob, no wheezing, cough, tightness, ectopy, pleurisy, recent travel  or syncope. She had been sob at home, she mentioned dependent lower extremity edema but no calf pain. Pfts today showed moderate restriction , low DLCO but supper corrected for [facility].   PMHX:   Past Medical History  Diagnosis Date  . Diabetes mellitus without complication   . Hypertension   . Thyroid disease     hypothyroidism  . OSA (obstructive sleep apnea)   . Vitamin D deficiency   . GERD (gastroesophageal reflux disease)   . CKD (chronic kidney disease)    Surgical Hx:  Past Surgical History  Procedure Laterality Date  . Cholecystectomy    . Tonsillectomy    . Cardiac catheterization N/A 03/25/2015    Procedure: Left Heart Cath and Coronary  Angiography;  Surgeon: Dalia Heading, MD;  Location: [facility] INVASIVE CV LAB;  Service: Cardiovascular;  Laterality: N/A;  . Cardiac catheterization N/A 03/25/2015    Procedure: Coronary Stent Intervention;  Surgeon: Marykay Lex, MD;  Location: [facility] INVASIVE CV LAB;  Service: Cardiovascular;  Laterality: N/A;   Family Hx:  Family History  Problem Relation Age of Onset  . CVA Father   . CAD Mother    Social Hx:   Social History  Substance Use Topics  . Smoking status: Former Smoker    Quit date: 03/24/1975  . Smokeless tobacco: None  . Alcohol Use: 0.0 oz/week    0 Standard drinks or equivalent per week     Comment: glass of wine occasionally   Medication:    Home Medication:  No current outpatient prescriptions on file.  Current Medication: @   Allergies:  Penicillins  Review of Systems: Gen:  Denies  fever, sweats, chills HEENT: Denies blurred vision, double vision, ear pain, eye pain, hearing loss, nose bleeds, sore throat Cvc:  No dizziness, chest pain or heaviness Resp:  Mild sob, no wheezing, cough, hemoptysis, calf pain  Gi: Denies swallowing difficulty, stomach pain, nausea or vomiting, diarrhea, constipation, bowel incontinence Gu:  Denies  bladder incontinence, burning urine Ext:   No Joint pain, stiffness or swelling Skin: No skin rash, easy bruising or bleeding or hives Endoc:  No polyuria, polydipsia , polyphagia or weight change Psych: No depression, insomnia or hallucinations  Other:  All other systems negative  Physical Examination:   VS: BP 126/68 mmHg  Pulse 68  Temp(Src) 98 F (36.7 C) (Oral)  Resp 19  Ht  (1.499 m)  Wt 203 lb 12.8 oz (92.443 kg)  BMI 41.14 kg/m2  SpO2 100%  General Appearance: No distress, no respiratory distress, obese  Neuro/psych without focal findings, mental status, speech normal, alert and oriented, cranial nerves 2-12 intact, reflexes normal and symmetric, sensation grossly normal  HEENT: PERRLA, EOM  intact, no ptosis, no other lesions noticed Neck: no stridor, jvd, thyromegaly Pulmonary:.No wheezing, No rales  Sputum Production:   Cardiovascular:  Normal S1,S2.  No m/r/g.      Abdomen:Benign, Soft, non-tender, No masses, obese Endoc: No evident thyromegaly, no signs of acromegaly or Cushing features Skin:   warm, no rashes, no ecchymosis  Extremities: normal, no cyanosis, clubbing, no edema, warm with normal capillary refill.    Labs results:   Recent Labs     03/24/15  1155  03/25/15  0422  03/26/15  0410  HGB  14.6  12.1  11.8*  HCT  44.5  36.8  34.5*  MCV  96.6  96.3  96.6  WBC  6.1  5.9  6.8  BUN  21*  18  16  CREATININE  1.23*  1.27*  1.26*  GLUCOSE  207*  201*  154*  CALCIUM  10.0  8.5*  8.5*  INR  0.93   --    --   ,    Rad results:  CLINICAL DATA: Midsternal chest pain, radiating to the back.  EXAM: CT ANGIOGRAPHY CHEST WITH CONTRAST  TECHNIQUE: Multidetector CT imaging of the chest was performed using the standard protocol during bolus administration of intravenous contrast. Multiplanar CT image reconstructions and MIPs were obtained to evaluate the vascular anatomy.  CONTRAST: 80mL OMNIPAQUE IOHEXOL 350 MG/ML SOLN  COMPARISON: Chest radiograph from the same date.  FINDINGS: There is no evidence of large central pulmonary embolus.  There is no evidence of thoracic aneurysm on dissection. Conventional branching of the thoracic aorta. Mild to moderate atherosclerotic disease of the aorta are with calcified plaque. The heart is normal in size. There is no evidence of pericardial effusion. No evidence of mediastinal or hilar lymphadenopathy. There is a short segment of circumferential thickening of the distal esophagus, few cm upstream from small hiatal hernia.  There is no evidence of significant focal lung parenchymal consolidation, pleural effusion or pneumothorax. No masses are identified ; no abnormal focal contrast enhancement is  seen. Right middle lobe atelectasis versus scarring noted.  No axillary lymphadenopathy is seen. The visualized portions of the thyroid gland are unremarkable in appearance.  The visualized portions of the spleen are unremarkable. Tiny few mm hypoattenuated foci within the liver are too small to be actually characterize, but likely represent cysts. There has been a prior cholecystectomy. There is bilateral renal cortical thinning.  The visualized portions of the pancreas and adrenal glands are within normal limits.  No acute osseous abnormalities are seen. Osteoarthritic changes at the thoracolumbar spine junction noted.  Review of the MIP images confirms the above findings.  IMPRESSION: No evidence of aortic dissection or aneurysmal dilation.  No evidence of large clinically significant pulmonary embolus.  Short  segment of thickening of the distal esophagus, upstream from small hiatal hernia. Further evaluation with endoscopy results is recommended.  Mild atherosclerotic disease of the aorta and tortuosity of the thoracic aorta.  Right middle lobe atelectasis versus scarring.  Tiny hypoattenuated foci within the liver, not completely evaluated, however likely representing cysts by appearance.  Renal cortical thinning.   Electronically Signed  By: Ted Mcalpine M.D.  On: 03/24/2015 15:22   Coronary angiography: Coronary dominance: Right   Left ventriculography: Left ventricular systolic function is normal , LVEF is estimated at 55 %, there is no significant mitral regurgitation   Final Conclusions:  Pt with two vessel cad with ramus intermedius as the IRV with timi 2 flow. D1 lesion is ostial and lad is normal. OM1 lesion is non critical  Recommendations: Refer to Dr. Ward Chatters for consideration of PCI of IRV ramus intermedius. COnsider reevaluation of left circ if symptoms persist.   Dalia Heading. MD, [facility] 03/25/2015, 1:38 PM Study  Conclusions  ECHO - Left ventricle: Wall thickness was increased in a pattern of mild LVH. Systolic function was normal. The estimated ejection fraction was in the range of 55% to 60%. - Aortic valve: Valve area (Vmax): 1.72 cm^2.  Assessment and Plan: This is a pleasant 1 y o  lady, past history of cigarette smoking, over weight, obstructive sleep apnea , not wearing her cpap. At risk of developing mild-moderate pulmonary hypertension. ? Cause for desat with activity. There may be a mild presence of copd. On pfts though the primary defect is extrathoracic restriction (obesity, increase abdominal girth). Most likely a contributor to the sob. Presently no indication of embolism.  She has  maintained great left ventricular function post NSTEMI,  no signs of failure.   Recommendation:  Weight loss Encourage to get back on cpap Wean fio2 as tolerated Prn albuterol Lower extremity venous doppler Following cardiology recs Reassess  in am dvt prophylaxis      I have personally obtained a history, examined the patient, evaluated laboratory and imaging results, formulated the assessment and plan and placed orders.  The Patient requires high complexity decision making for assessment and support, frequent evaluation and titration of therapies, application of advanced monitoring technologies and extensive interpretation of multiple databases.   Herbon Fleming,M.D. Pulmonary & Critical care Medicine [facility] [place]

## 2015-03-26 NOTE — Progress Notes (Signed)
Patient ID: Suzanne Francis, female   DOB: 1940/11/22, 74 y.o.   MRN: [id number] SUBJECTIVE:  Admitted with NSTEMI; s/p cath, with one lesion not amenable to intervention and DES placed in RI.  One episode nausea; no chest pain, dyspnea, or other symptoms.  Hasn't really been OOB.    ______________________________________________________________________  ROS: Please see HPI; remainder of complete 10 point ROS is negative   Past Medical History  Diagnosis Date  . Diabetes mellitus without complication   . Hypertension   . Thyroid disease     hypothyroidism  . OSA (obstructive sleep apnea)   . Vitamin D deficiency   . GERD (gastroesophageal reflux disease)   . CKD (chronic kidney disease)     Past Surgical History  Procedure Laterality Date  . Cholecystectomy    . Tonsillectomy       Current facility-administered medications:  .  0.9 %  sodium chloride infusion, , Intravenous, Continuous, Enid Baas, MD, Last Rate: 100 mL/hr at 03/25/15 1654 .  0.9 %  sodium chloride infusion, 250 mL, Intravenous, PRN, Dalia Heading, MD .  acetaminophen (TYLENOL) tablet 650 mg, 650 mg, Oral, Q6H PRN **OR** acetaminophen (TYLENOL) suppository 650 mg, 650 mg, Rectal, Q6H PRN, Enid Baas, MD .  acetaminophen (TYLENOL) tablet 650 mg, 650 mg, Oral, Q4H PRN, Dalia Heading, MD .  aspirin EC tablet 81 mg, 81 mg, Oral, Daily, Dalia Heading, MD .  escitalopram (LEXAPRO) tablet 10 mg, 10 mg, Oral, Daily, Enid Baas, MD, 10 mg at 03/24/15 1815 .  glipiZIDE (GLUCOTROL XL) 24 hr tablet 5 mg, 5 mg, Oral, Daily, Curtis Sites III, MD, 5 mg at 03/25/15 1000 .  insulin aspart (novoLOG) injection 0-5 Units, 0-5 Units, Subcutaneous, QHS, Enid Baas, MD, 0 Units at 03/24/15 2200 .  insulin aspart (novoLOG) injection 0-9 Units, 0-9 Units, Subcutaneous, TID WC, Enid Baas, MD, 0 Units at 03/25/15 0800 .  levothyroxine (SYNTHROID, LEVOTHROID) tablet 125 mcg, 125 mcg, Oral, QAC  breakfast, Enid Baas, MD, 125 mcg at 03/25/15 0800 .  lisinopril (PRINIVIL,ZESTRIL) tablet 40 mg, 40 mg, Oral, Daily, Enid Baas, MD .  metoprolol tartrate (LOPRESSOR) tablet 25 mg, 25 mg, Oral, BID, Enid Baas, MD, 25 mg at 03/25/15 2210 .  morphine 2 MG/ML injection 2 mg, 2 mg, Intravenous, Q4H PRN, Enid Baas, MD, 2 mg at 03/25/15 1459 .  nitroGLYCERIN (NITROSTAT) SL tablet 0.4 mg, 0.4 mg, Sublingual, Q5 min PRN, Dalia Heading, MD, 0.4 mg at 03/25/15 1433 .  ondansetron (ZOFRAN) injection 4 mg, 4 mg, Intravenous, Q4H PRN, Wyatt Haste, MD .  oxyCODONE (Oxy IR/ROXICODONE) immediate release tablet 5 mg, 5 mg, Oral, Q4H PRN, Enid Baas, MD, 5 mg at 03/25/15 0036 .  pioglitazone (ACTOS) tablet 30 mg, 30 mg, Oral, Daily, Enid Baas, MD .  rosuvastatin (CRESTOR) tablet 20 mg, 20 mg, Oral, q1800, Enid Baas, MD .  sodium chloride 0.9 % injection 3 mL, 3 mL, Intravenous, Q12H, Enid Baas, MD, 3 mL at 03/25/15 2210 .  sodium chloride 0.9 % injection 3 mL, 3 mL, Intravenous, Q12H, Dalia Heading, MD, 3 mL at 03/25/15 1727 .  sodium chloride 0.9 % injection 3 mL, 3 mL, Intravenous, PRN, Dalia Heading, MD .  ticagrelor (BRILINTA) tablet 90 mg, 90 mg, Oral, BID, Dalia Heading, MD, 90 mg at 03/25/15 2210  PHYSICAL EXAM:  BP 153/73 mmHg  Pulse 76  Temp(Src) 98.6 F (37 C) (Oral)  Resp 20  Ht  (  1.499 m)  Wt 92.443 kg (203 lb 12.8 oz)  BMI 41.14 kg/m2  SpO2 93%  General: Well developed, well nourished female, in NAD HEENT: PERRL; OP moist without lesions. Neck: supple, trachea midline, no thyromegaly Chest: normal to palpation Lungs: clear bilaterally without retractions or wheezes Cardiovascular: RRR, no murmur, no gallop; distal pulses 2+ Abdomen: soft, nontender, nondistended, positive bowel sounds Extremities: no clubbing, cyanosis, edema Neuro: alert and oriented, moves all extremities Derm: no significant rashes or nodules;  good skin turgor Lymph: no cervical or supraclavicular lymphadenopathy  Labs and imaging studies were reviewed  ASSESSMENT/PLAN:   1. NSTEMI, acute- Cath results reviewed and case discussed yesterday with cardiology; greatly appreciate their efforts.  Cont medical therapy; mobilize as per cardiology. 2. DM- reasonable control; cover with SSI 3. OSA-cont CPAP when sleeping 4. HTN- controlled with current meds  Hopefully home later today if ambulating w/o sig symptoms

## 2015-03-26 NOTE — Progress Notes (Signed)
PFT completed and placed on chart

## 2015-03-27 LAB — CK ISOENZYMES
CK MB: 0 % (ref 0–3)
CK-BB: 0 %
CK-MM: 100 % (ref 97–100)
Creatine Kinase-Total: 60 U/L (ref 24–173)
MACRO TYPE 1: 0 %
MACRO TYPE 2: 0 %

## 2015-03-27 LAB — GLUCOSE, CAPILLARY
Glucose-Capillary: 136 mg/dL — ABNORMAL HIGH (ref 65–99)
Glucose-Capillary: 138 mg/dL — ABNORMAL HIGH (ref 65–99)

## 2015-03-27 MED ORDER — TICAGRELOR 90 MG PO TABS: 90.0000 mg | ORAL_TABLET | Freq: Two times a day (BID) | ORAL | Status: DC

## 2015-03-27 MED ORDER — ASPIRIN 81 MG PO TBEC: 81.0000 mg | DELAYED_RELEASE_TABLET | Freq: Every day | ORAL | Status: DC

## 2015-03-27 MED ORDER — ROSUVASTATIN CALCIUM 20 MG PO TABS: 20.0000 mg | ORAL_TABLET | Freq: Every day | ORAL | Status: DC

## 2015-03-27 MED ORDER — NITROGLYCERIN 0.4 MG SL SUBL: 0.4000 mg | SUBLINGUAL_TABLET | SUBLINGUAL | Status: AC | PRN

## 2015-03-27 MED ORDER — METOPROLOL TARTRATE 25 MG PO TABS: 25.0000 mg | ORAL_TABLET | Freq: Two times a day (BID) | ORAL | Status: DC

## 2015-03-27 NOTE — Progress Notes (Signed)
Patients home medications picked up from pharmacy and given to son at discharge.

## 2015-03-27 NOTE — Clinical Documentation Improvement (Signed)
Internal Medicine  Can the diagnosis of CKD be further specified?   CKD Stage I - GFR greater than or equal to 90  CKD Stage II - GFR 60-89  CKD Stage III - GFR 30-59  CKD Stage IV - GFR 15-29  CKD Stage V - GFR < 15  ESRD (End Stage Renal Disease)  Other condition  Unable to clinically determine  Supporting Information: : (risk factors, signs and symptoms, diagnostics, treatment) 03/27/15 progr note..."CKD (chronic kidney disease)"...  Ref. Range 03/24/2015 11:55 03/25/2015 04:22 03/26/2015 04:10  EGFR (Non-African Amer.) Latest Ref Range: >60 mL/min 42 (L) 41 (L) 41 (L)   Please exercise your independent, professional judgment when responding. A specific answer is not anticipated or expected.  Thank You, Ermelinda Das, RN, BSN, CCDS Certified Clinical Documentation Specialist Pager: [place]: Health Information Management

## 2015-03-27 NOTE — Progress Notes (Signed)
Patient ID: Suzanne Francis, female   DOB: 1941-01-17, 74 y.o.   MRN: [id number] SUBJECTIVE:  Admitted with NSTEMI; s/p cath, with one lesion not amenable to intervention and DES placed in RI.  No problems overnight; remains on oxygen after noted to have sig desaturation with ambulation  ______________________________________________________________________  ROS: Please see HPI; remainder of complete 10 point ROS is negative   Past Medical History  Diagnosis Date  . Diabetes mellitus without complication   . Hypertension   . Thyroid disease     hypothyroidism  . OSA (obstructive sleep apnea)   . Vitamin D deficiency   . GERD (gastroesophageal reflux disease)   . CKD (chronic kidney disease)     Past Surgical History  Procedure Laterality Date  . Cholecystectomy    . Tonsillectomy    . Cardiac catheterization N/A 03/25/2015    Procedure: Left Heart Cath and Coronary Angiography;  Surgeon: Dalia Heading, MD;  Location: [facility] INVASIVE CV LAB;  Service: Cardiovascular;  Laterality: N/A;  . Cardiac catheterization N/A 03/25/2015    Procedure: Coronary Stent Intervention;  Surgeon: Marykay Lex, MD;  Location: [facility] INVASIVE CV LAB;  Service: Cardiovascular;  Laterality: N/A;     Current facility-administered medications:  .  0.9 %  sodium chloride infusion, 250 mL, Intravenous, PRN, Dalia Heading, MD .  acetaminophen (TYLENOL) tablet 650 mg, 650 mg, Oral, Q6H PRN **OR** acetaminophen (TYLENOL) suppository 650 mg, 650 mg, Rectal, Q6H PRN, Enid Baas, MD .  acetaminophen (TYLENOL) tablet 650 mg, 650 mg, Oral, Q4H PRN, Dalia Heading, MD .  aspirin EC tablet 81 mg, 81 mg, Oral, Daily, Dalia Heading, MD, 81 mg at 03/27/15 0746 .  escitalopram (LEXAPRO) tablet 10 mg, 10 mg, Oral, Daily, Enid Baas, MD, 10 mg at 03/27/15 0746 .  glipiZIDE (GLUCOTROL XL) 24 hr tablet 5 mg, 5 mg, Oral, Daily, Curtis Sites III, MD, 5 mg at 03/27/15 0745 .  insulin aspart (novoLOG) injection  0-5 Units, 0-5 Units, Subcutaneous, QHS, Enid Baas, MD, 0 Units at 03/24/15 2200 .  insulin aspart (novoLOG) injection 0-9 Units, 0-9 Units, Subcutaneous, TID WC, Enid Baas, MD, 1 Units at 03/27/15 0742 .  levothyroxine (SYNTHROID, LEVOTHROID) tablet 125 mcg, 125 mcg, Oral, QAC breakfast, Enid Baas, MD, 125 mcg at 03/27/15 0746 .  lisinopril (PRINIVIL,ZESTRIL) tablet 40 mg, 40 mg, Oral, Daily, Enid Baas, MD, 40 mg at 03/27/15 0742 .  metoprolol tartrate (LOPRESSOR) tablet 25 mg, 25 mg, Oral, BID, Enid Baas, MD, 25 mg at 03/26/15 2122 .  morphine 2 MG/ML injection 2 mg, 2 mg, Intravenous, Q4H PRN, Enid Baas, MD, 2 mg at 03/25/15 1459 .  nitroGLYCERIN (NITROSTAT) SL tablet 0.4 mg, 0.4 mg, Sublingual, Q5 min PRN, Dalia Heading, MD, 0.4 mg at 03/25/15 1433 .  ondansetron (ZOFRAN) injection 4 mg, 4 mg, Intravenous, Q4H PRN, Wyatt Haste, MD .  oxyCODONE (Oxy IR/ROXICODONE) immediate release tablet 5 mg, 5 mg, Oral, Q4H PRN, Enid Baas, MD, 5 mg at 03/25/15 0036 .  pioglitazone (ACTOS) tablet 30 mg, 30 mg, Oral, Daily, Enid Baas, MD, 30 mg at 03/27/15 0746 .  rosuvastatin (CRESTOR) tablet 20 mg, 20 mg, Oral, q1800, Enid Baas, MD, 20 mg at 03/26/15 1715 .  sodium chloride 0.9 % injection 3 mL, 3 mL, Intravenous, Q12H, Enid Baas, MD, 3 mL at 03/26/15 1000 .  sodium chloride 0.9 % injection 3 mL, 3 mL, Intravenous, Q12H, Dalia Heading, MD, 3 mL at 03/27/15 0748 .  sodium chloride 0.9 % injection 3 mL, 3 mL, Intravenous, PRN, Dalia Heading, MD .  ticagrelor [facility]) tablet 90 mg, 90 mg, Oral, BID, Dalia Heading, MD, 90 mg at 03/27/15 0746  PHYSICAL EXAM:  BP 104/76 mmHg  Pulse 68  Temp(Src) 97.6 F (36.4 C) (Oral)  Resp 16  Ht  (1.499 m)  Wt 92.443 kg (203 lb 12.8 oz)  BMI 41.14 kg/m2  SpO2 100%  General: Well developed, well nourished female, in NAD HEENT: PERRL; OP moist without lesions. Neck: supple,  trachea midline, no thyromegaly Chest: normal to palpation Lungs: clear bilaterally without retractions or wheezes Cardiovascular: RRR, no murmur, no gallop; distal pulses 2+ Abdomen: soft, nontender, nondistended, positive bowel sounds Extremities: no clubbing, cyanosis, edema Neuro: alert and oriented, moves all extremities Derm: no significant rashes or nodules; good skin turgor Lymph: no cervical or supraclavicular lymphadenopathy  Labs and imaging studies were reviewed  ASSESSMENT/PLAN:   1. NSTEMI, acute- Cont medical therapy; mobilize as per cardiology.  On ASA, Brilinta, which should be adequate DVT prophyllaxis as well 2. DM- stable; cover with SSI if needed 3. OSA-needs to use CPAP when sleeping 4. HTN- controlled 5. Hypoxia with ambulation- appreciate pulmonary; PFT's with reduced DLCO and restrictive pattern. Will have pt ambulate again today and recheck; she is aware she may need home oxygen.  Will reevaluate at lunch hr to determine if able to d/c home

## 2015-03-27 NOTE — Discharge Summary (Signed)
Physician Discharge Summary  Patient ID: Suzanne Francis MRN: [id number] DOB/AGE: December 18, 1940 74 y.o.  Admit date: 03/24/2015 Discharge date: 03/27/2015  Admission Diagnoses:  Discharge Diagnoses:  Principal Problem:   NSTEMI (non-ST elevated myocardial infarction) Active Problems:   Coronary artery disease involving native coronary artery of native heart with unstable angina pectoris   Discharged Condition: stable  Hospital Course: Admitted with cardiac enzymes positive for NSTEMI.  Underwent cardiac cath, with 2 lesions noted.  Underwent DES to one lesion; 2nd lesion not amenable to intervention (see cath report).  Tolerated well, with no further chest pain or nausea.  Started on Crestor, lopressor, Brilinta, and tolerated these.    Ambulated on day after PCI/DES; sats fell to 70's.  Improved with oxygen.  Had had CT angio chest on arrival, without sig findings. Had minimal symptoms. PFT's performed, with restrictive lung disease.  Pulmonology consulted.  Pt mobilized, and repeat ambulation on RA the following day showed sats 100%, dropping to lowest of 98%.  Pt felt well, and will be d/c'ed home in stable condition.  Diet should be low fat, low cholesterol, carbohydrate controlled.  She will f/u with Cardiology and in our office in the next week.  DM was reasonably controlled; metformin held after cath.  She will resume home DM meds and monitor and record glucose at least daily.  Consults: cardiology and pulmonary/intensive care  Significant Diagnostic Studies: CTA chest, PFT's,  cardiac cath   Discharge Exam: Blood pressure 126/52, pulse 64, temperature 98.5 F (36.9 C), temperature source Oral, resp. rate 19, height  (1.499 m), weight 92.443 kg (203 lb 12.8 oz), SpO2 98 %.   Disposition:       Discharge Instructions    Diet - low sodium heart healthy    Complete by:  As directed      Increase activity slowly    Complete by:  As directed             Medication  List    TAKE these medications        aspirin 81 MG EC tablet  Take 1 tablet (81 mg total) by mouth daily.     colchicine 0.6 MG tablet  Take 0.6-1.2 mg by mouth as needed (for gout flares).     escitalopram 10 MG tablet  Commonly known as:  LEXAPRO  Take 10 mg by mouth daily.     glipiZIDE 10 MG 24 hr tablet  Commonly known as:  GLUCOTROL XL  Take 10 mg by mouth daily.     hydrochlorothiazide 25 MG tablet  Commonly known as:  HYDRODIURIL  Take 25 mg by mouth daily.     levothyroxine 125 MCG tablet  Commonly known as:  SYNTHROID, LEVOTHROID  Take 125 mcg by mouth daily before breakfast.     lisinopril 40 MG tablet  Commonly known as:  PRINIVIL,ZESTRIL  Take 40 mg by mouth daily.     metFORMIN 500 MG tablet  Commonly known as:  GLUCOPHAGE  Take 500 mg by mouth 2 (two) times daily with a meal.     metoprolol tartrate 25 MG tablet  Commonly known as:  LOPRESSOR  Take 1 tablet (25 mg total) by mouth 2 (two) times daily.     nitroGLYCERIN 0.4 MG SL tablet  Commonly known as:  NITROSTAT  Place 1 tablet (0.4 mg total) under the tongue every 5 (five) minutes as needed for chest pain.     pioglitazone 30 MG tablet  Commonly known as:  ACTOS  Take 30 mg by mouth daily.     rosuvastatin 20 MG tablet  Commonly known as:  CRESTOR  Take 1 tablet (20 mg total) by mouth daily at 6 PM.     ticagrelor 90 MG Tabs tablet  Commonly known as:  BRILINTA  Take 1 tablet (90 mg total) by mouth 2 (two) times daily.       Follow-up Information    Follow up with Harold Hedge A., MD In 1 week.   Specialty:  Cardiology   Contact information:   [address] [place] [address] [phone]       Follow up with BERT Maryjean Ka III, MD In 1 week.   Specialty:  Internal Medicine   Contact information:   [address] [place] [address] [phone]       Signed: Curtis Sites III 03/27/2015, 12:25 PM

## 2015-03-27 NOTE — Discharge Instructions (Signed)
Acute Coronary Syndrome  Acute coronary syndrome (ACS) is an urgent problem in which the blood and oxygen supply to the heart is critically deficient. ACS requires hospitalization because one or more coronary arteries may be blocked.  ACS represents a range of conditions including:  · Previous angina that is now unstable, lasts longer, happens at rest, or is more intense.  · A heart attack, with heart muscle cell injury and death.  There are three vital coronary arteries that supply the heart muscle with blood and oxygen so that it can pump blood effectively. If blockages to these arteries develop, blood flow to the heart muscle is reduced. If the heart does not get enough blood, angina may occur as the first warning sign.  SYMPTOMS   · The most common signs of angina include:  ¨ Tightness or squeezing in the chest.  ¨ Feeling of heaviness on the chest.  ¨ Discomfort in the arms, neck, back, or jaw.  ¨ Shortness of breath and nausea.  ¨ Cold, wet skin.  · Angina is usually brought on by physical effort or excitement which increase the oxygen needs of the heart. These states increase the blood flow needs of the heart beyond what can be delivered.  · Other symptoms that are not as common include:  ¨ Fatigue  ¨ Unexplained feelings of nervousness or anxiety  ¨ Weakness  ¨ Diarrhea  · Sometimes, you may not have noticed any symptoms at all but still suffered a cardiac injury.  TREATMENT   · Medicines to help discomfort may include nitroglycerin (nitro) in the form of tablets or a spray for rapid relief, or longer-acting forms such as cream, patches, or capsules. (Be aware that there are many side effects and possible interactions with other drugs).  · Other medicines may be used to help the heart pump better.  · Procedures to open blocked arteries including angioplasty or stent placement to keep the arteries open.  · Open heart surgery may be needed when there are many blockages or they are in critical locations that  are best treated with surgery.  HOME CARE INSTRUCTIONS   · Do not use any tobacco products including cigarettes, chewing tobacco, or electronic cigarettes.  · Take one baby or adult aspirin daily, if your health care provider advises. This helps reduce the risk of a heart attack.  · It is very important that you follow the angina treatment prescribed by your health care provider. Make arrangements for proper follow-up care.  · Eat a heart healthy diet with salt and fat restrictions as advised.  · Regular exercise is good for you as long as it does not cause discomfort. Do not begin any new type of exercise until you check with your health care provider.  · If you are overweight, you should lose weight.  · Try to maintain normal blood lipid levels.  · Keep your blood pressure under control as recommended by your health care provider.  · You should tell your health care provider right away about any increase in the severity or frequency of your chest discomfort or angina attacks. When you have angina, you should stop what you are doing and sit down. This may bring relief in 3 to 5 minutes. If your health care provider has prescribed nitro, take it as directed.  · If your health care provider has given you a follow-up appointment, it is very important to keep that appointment. Not keeping the appointment could result in a chronic or   permanent injury, pain, and disability. If there is any problem keeping the appointment, you must call back to this facility for assistance.  SEEK IMMEDIATE MEDICAL CARE IF:   · You develop nausea, vomiting, or shortness of breath.  · You feel faint, lightheaded, or pass out.  · Your chest discomfort gets worse.  · You are sweating or experience sudden profound fatigue.  · You do not get relief of your chest pain after 3 doses of nitro.  · Your discomfort lasts longer than 15 minutes.  MAKE SURE YOU:   · Understand these instructions.  · Will watch your condition.  · Will get help right  away if you are not doing well or get worse.  · Take all medicines as directed by your health care provider.  Document Released: 06/20/2005 Document Revised: 06/25/2013 Document Reviewed: 10/22/2013  ExitCare® Patient Information ©2015 [employer]. This information is not intended to replace advice given to you by your health care provider. Make sure you discuss any questions you have with your health care provider.

## 2015-03-27 NOTE — Progress Notes (Signed)
Pt. Discharged to home via wc. Discharge instructions and medication regimen reviewed at bedside with patient. Pt. verbalizes understanding of instructions and medication regimen. Prescriptions included with discharge papers. Patient able to verbalize care of cath site for the next 24 hours. Mynx dressing removed and bandaid placed over site. Patient assessment unchanged from this morning. TELE and IV discontinued per policy.

## 2015-03-27 NOTE — Care Management (Signed)
Patient will not be in need of home health follow up per attending.  02 sats wnl

## 2015-11-10 ENCOUNTER — Other Ambulatory Visit: Payer: Self-pay | Admitting: Internal Medicine

## 2015-11-10 DIAGNOSIS — Z1231 Encounter for screening mammogram for malignant neoplasm of breast: Secondary | ICD-10-CM

## 2015-11-25 ENCOUNTER — Ambulatory Visit: Payer: Medicare Other

## 2015-12-09 ENCOUNTER — Ambulatory Visit: Payer: Medicare Other

## 2015-12-11 ENCOUNTER — Encounter
Admission: RE | Disposition: A | Discharge status: Home / Self Care | Payer: Self-pay | Source: Ambulatory Visit | Attending: Cardiology

## 2015-12-11 ENCOUNTER — Encounter: Payer: Self-pay | Admitting: *Deleted

## 2015-12-11 ENCOUNTER — Ambulatory Visit
Admission: RE | Admit: 2015-12-11 | Discharge: 2015-12-11 | Disposition: A | Discharge status: Home / Self Care | Payer: Medicare Other | Source: Ambulatory Visit | Attending: Cardiology | Admitting: Cardiology

## 2015-12-11 DIAGNOSIS — I25110 Atherosclerotic heart disease of native coronary artery with unstable angina pectoris: Secondary | ICD-10-CM | POA: Insufficient documentation

## 2015-12-11 DIAGNOSIS — I214 Non-ST elevation (NSTEMI) myocardial infarction: Secondary | ICD-10-CM | POA: Insufficient documentation

## 2015-12-11 DIAGNOSIS — G4733 Obstructive sleep apnea (adult) (pediatric): Secondary | ICD-10-CM | POA: Diagnosis not present

## 2015-12-11 DIAGNOSIS — I252 Old myocardial infarction: Secondary | ICD-10-CM | POA: Insufficient documentation

## 2015-12-11 DIAGNOSIS — R0789 Other chest pain: Secondary | ICD-10-CM | POA: Insufficient documentation

## 2015-12-11 DIAGNOSIS — E785 Hyperlipidemia, unspecified: Secondary | ICD-10-CM | POA: Insufficient documentation

## 2015-12-11 DIAGNOSIS — I129 Hypertensive chronic kidney disease with stage 1 through stage 4 chronic kidney disease, or unspecified chronic kidney disease: Secondary | ICD-10-CM | POA: Insufficient documentation

## 2015-12-11 DIAGNOSIS — E1122 Type 2 diabetes mellitus with diabetic chronic kidney disease: Secondary | ICD-10-CM | POA: Insufficient documentation

## 2015-12-11 DIAGNOSIS — N183 Chronic kidney disease, stage 3 (moderate): Secondary | ICD-10-CM | POA: Diagnosis not present

## 2015-12-11 DIAGNOSIS — Z794 Long term (current) use of insulin: Secondary | ICD-10-CM | POA: Diagnosis not present

## 2015-12-11 HISTORY — DX: Atherosclerotic heart disease of native coronary artery without angina pectoris: I25.10

## 2015-12-11 HISTORY — DX: Unspecified osteoarthritis, unspecified site: M19.90

## 2015-12-11 HISTORY — PX: CARDIAC CATHETERIZATION: SHX172

## 2015-12-11 SURGERY — RIGHT/LEFT HEART CATH AND CORONARY ANGIOGRAPHY
Anesthesia: Moderate Sedation

## 2015-12-11 MED ORDER — HYDRALAZINE HCL 20 MG/ML IJ SOLN
INTRAMUSCULAR | Status: AC
  Filled 2015-12-11: qty 1

## 2015-12-11 MED ORDER — SODIUM CHLORIDE 0.9 % WEIGHT BASED INFUSION: 3.0000 mL/kg/h | INTRAVENOUS | Status: DC

## 2015-12-11 MED ORDER — NITROGLYCERIN 2 % TD OINT
TOPICAL_OINTMENT | TRANSDERMAL | Status: DC | PRN
  Administered 2015-12-11: 1 [in_us] via TOPICAL

## 2015-12-11 MED ORDER — FENTANYL CITRATE (PF) 100 MCG/2ML IJ SOLN
INTRAMUSCULAR | Status: AC
  Filled 2015-12-11: qty 2

## 2015-12-11 MED ORDER — HEPARIN (PORCINE) IN NACL 2-0.9 UNIT/ML-% IJ SOLN
INTRAMUSCULAR | Status: AC
  Filled 2015-12-11: qty 500

## 2015-12-11 MED ORDER — SODIUM CHLORIDE 0.9 % IV SOLN: 250.0000 mL | INTRAVENOUS | Status: DC | PRN

## 2015-12-11 MED ORDER — SODIUM CHLORIDE 0.9% FLUSH: 3.0000 mL | INTRAVENOUS | Status: DC | PRN

## 2015-12-11 MED ORDER — NITROGLYCERIN 2 % TD OINT
TOPICAL_OINTMENT | TRANSDERMAL | Status: AC
  Filled 2015-12-11: qty 1

## 2015-12-11 MED ORDER — FENTANYL CITRATE (PF) 100 MCG/2ML IJ SOLN
INTRAMUSCULAR | Status: DC | PRN
  Administered 2015-12-11 (×2): 25 ug via INTRAVENOUS

## 2015-12-11 MED ORDER — SODIUM CHLORIDE 0.9% FLUSH: 3.0000 mL | Freq: Two times a day (BID) | INTRAVENOUS | Status: DC

## 2015-12-11 MED ORDER — HYDRALAZINE HCL 20 MG/ML IJ SOLN
INTRAMUSCULAR | Status: DC | PRN
  Administered 2015-12-11 (×2): 5 mg via INTRAVENOUS

## 2015-12-11 MED ORDER — MIDAZOLAM HCL 2 MG/2ML IJ SOLN
INTRAMUSCULAR | Status: DC | PRN
  Administered 2015-12-11 (×2): 1 mg via INTRAVENOUS

## 2015-12-11 MED ORDER — MIDAZOLAM HCL 2 MG/2ML IJ SOLN
INTRAMUSCULAR | Status: AC
  Filled 2015-12-11: qty 2

## 2015-12-11 SURGICAL SUPPLY — 13 items
CATH INFINITI 5FR ANG PIGTAIL (CATHETERS) ×3 IMPLANT
CATH INFINITI 5FR JL4 (CATHETERS) ×3 IMPLANT
CATH INFINITI JR4 5F (CATHETERS) ×3 IMPLANT
CATH SWANZ 7F THERMO (CATHETERS) ×3 IMPLANT
DEVICE CLOSURE MYNXGRIP 5F (Vascular Products) ×3 IMPLANT
GUIDEWIRE EMER 3M J .025X150CM (WIRE) ×3 IMPLANT
KIT MANI 3VAL PERCEP (MISCELLANEOUS) ×3 IMPLANT
KIT RIGHT HEART (MISCELLANEOUS) ×3 IMPLANT
NEEDLE PERC 18GX7CM (NEEDLE) ×3 IMPLANT
PACK CARDIAC CATH (CUSTOM PROCEDURE TRAY) ×3 IMPLANT
SHEATH AVANTI 5FR X 11CM (SHEATH) ×3 IMPLANT
SHEATH PINNACLE 7F 10CM (SHEATH) ×3 IMPLANT
WIRE EMERALD 3MM-J .035X150CM (WIRE) ×3 IMPLANT

## 2015-12-11 NOTE — H&P (Signed)
Liver enlargement: no Pulsatile aorta: no Ascites: no Bruits: no  EXTREMITIES Clubbing: no Edema: trace to 1+ bilateral pedal edema Pulses: peripheral pulses symmetrical Femoral Bruits: no Amputation: no SKIN Rash: no Cyanosis: no Embolic phemonenon: no Bruising: no NEURO Alert and Oriented to person, place and time: yes Non focal: yes  PSYCH: Pt appears to have normal affect  LABS REVIEWED Last 3 CBC results: Lab Results  Component Value Date  WBC 4.5 11/04/2015  WBC 5.2 09/07/2015  WBC 5.1 06/01/2015   Lab Results  Component Value Date  HGB 12.7 11/04/2015  HGB 13.0 09/07/2015  HGB 12.3 06/01/2015   Lab Results  Component Value Date  HCT 38.2 11/04/2015  HCT 40.7 09/07/2015  HCT 38.4 06/01/2015   Lab Results  Component Value Date  PLT 209 11/04/2015  PLT 236 09/07/2015  PLT 249 06/01/2015   Lab Results  Component Value Date  CREATININE 1.3 (H) 11/10/2015  BUN 25 11/10/2015  NA 144 11/10/2015  K 4.4 11/10/2015  CL 105 11/10/2015  CO2 32.0 11/10/2015   Lab Results  Component Value Date  HGBA1C 7.1 (H) 11/04/2015   Lab Results  Component Value Date  HDL 56.9 10/27/2014   Lab Results  Component Value Date  LDLCALC 111 10/27/2014   Lab Results  Component Value Date  TRIG 151 10/27/2014   Lab Results  Component Value Date  ALT 9 11/04/2015  AST 14 11/04/2015  ALKPHOS 59 11/04/2015   Lab Results  Component Value Date  TSH 1.481 06/01/2015   Assessment and Plan   75 y.o. female with  ICD-10-CM ICD-9-CM  1. CAD in native artery-patient is status post non ST elevation myocardial infarction felt to be secondary to a ramus intermedius lesion. This was stented with a drug-eluting stent. She had small to medium ostia 1st diagonal lesion that would treated medically as well as a 60% circumflex lesion she has right-sided chest pain. Functional study revealed no evidence of ischemia with preserved LV function. Continues to have symptoms. This  is concerning for possible progression of her coronary disease as this is what she felt prior to her stent. Risks and benefits of left heart cath were discussed with the patient. We will proceed with a left and right heart cath due to pulmonary pressures to better evaluate further treatment options I25.10 414.01     2. Atherosclerosis of native coronary artery of native heart with unstable angina pectoris-as per above I25.110 414.01  411.1  3. Other hyperlipidemia E78.4 272.4  4. Essential hypertension-blood pressure is controlled with metoprolol and lisinopril. Weight loss and dash diet is recommended I10 401.9  5. Non-ST elevation (NSTEMI) myocardial infarction-as per above I21.4 410.70  6. CKD (chronic kidney disease) stage 3, GFR 30-59 ml/min-currently stable N18.3 585.3  7. Controlled diabetes mellitus type 2 with complications, unspecified long term insulin use status E11.8 250.90  8. Obstructive sleep apnea syndrome-recommend use of CPAP. We discussed at length the importance of CPAP compliance as her symptoms may be secondary to untreated sleep apnea with resultant increasing pulmonary pressures. She will consider being more compliant with this. Will need to consider proceeding with right and left heart cath to evaluate coronary anatomy and pulmonary pressures G47.33 327.23   Return in about 4 weeks (around 01/01/2016).  These notes generated with voice recognition software. I apologize for typographical errors.  Denton ArKENNETH ALAN Kashmir Leedy, MD  H and P reviewed with patient and pt examined. No change from above. Proceed with left cardiac cath for unstable angina.

## 2015-12-11 NOTE — Discharge Instructions (Signed)
Coronary Angiogram °A coronary angiogram, also called coronary angiography, is an X-ray procedure used to look at the arteries in the heart. In this procedure, a dye (contrast dye) is injected through a long, hollow tube (catheter). The catheter is about the size of a piece of cooked spaghetti and is inserted through your groin, wrist, or arm. The dye is injected into each artery, and X-rays are then taken to show if there is a blockage in the arteries of your heart. °LET YOUR HEALTH CARE PROVIDER KNOW ABOUT: °· Any allergies you have, including allergies to shellfish or contrast dye.   °· All medicines you are taking, including vitamins, herbs, eye drops, creams, and over-the-counter medicines.   °· Previous problems you or members of your family have had with the use of anesthetics.   °· Any blood disorders you have.   °· Previous surgeries you have had. °· History of kidney problems or failure.   °· Other medical conditions you have. °RISKS AND COMPLICATIONS  °Generally, a coronary angiogram is a safe procedure. However, problems can occur and include: °· Allergic reaction to the dye. °· Bleeding from the access site or other locations. °· Kidney injury, especially in people with impaired kidney function.  °· Stroke (rare). °· Heart attack (rare). °BEFORE THE PROCEDURE  °· Do not eat or drink anything after midnight the night before the procedure or as directed by your health care provider.   °· Ask your health care provider about changing or stopping your regular medicines. This is especially important if you are taking diabetes medicines or blood thinners. °PROCEDURE °· You may be given a medicine to help you relax (sedative) before the procedure. This medicine is given through an intravenous (IV) access tube that is inserted into one of your veins.   °· The area where the catheter will be inserted will be washed and shaved. This is usually done in the groin but may be done in the fold of your arm (near your  elbow) or in the wrist.    °· A medicine will be given to numb the area where the catheter will be inserted (local anesthetic).   °· The health care provider will insert the catheter into an artery. The catheter will be guided by using a special type of X-ray (fluoroscopy) of the blood vessel being examined.   °· A special dye will then be injected into the catheter, and X-rays will be taken. The dye will help to show where any narrowing or blockages are located in the heart arteries.   °AFTER THE PROCEDURE  °· If the procedure is done through the leg, you will be kept in bed lying flat for several hours. You will be instructed to not bend or cross your legs. °· The insertion site will be checked frequently.   °· The pulse in your feet or wrist will be checked frequently.   °· Additional blood tests, X-rays, and an electrocardiogram may be done.   °  °This information is not intended to replace advice given to you by your health care provider. Make sure you discuss any questions you have with your health care provider. °  °Document Released: 12/25/2002 Document Revised: 07/11/2014 Document Reviewed: 11/12/2012 °Elsevier Interactive Patient Education ©2016 [employer]. ° °

## 2015-12-15 ENCOUNTER — Other Ambulatory Visit: Payer: Self-pay | Admitting: Internal Medicine

## 2015-12-15 ENCOUNTER — Ambulatory Visit
Admission: RE | Admit: 2015-12-15 | Discharge: 2015-12-15 | Disposition: A | Discharge status: Home / Self Care | Payer: Medicare Other | Source: Ambulatory Visit | Attending: Internal Medicine | Admitting: Internal Medicine

## 2015-12-15 DIAGNOSIS — Z1231 Encounter for screening mammogram for malignant neoplasm of breast: Secondary | ICD-10-CM

## 2017-01-16 ENCOUNTER — Other Ambulatory Visit: Payer: Self-pay | Admitting: Internal Medicine

## 2017-01-16 DIAGNOSIS — Z1231 Encounter for screening mammogram for malignant neoplasm of breast: Secondary | ICD-10-CM

## 2017-01-30 ENCOUNTER — Ambulatory Visit
Admission: RE | Admit: 2017-01-30 | Discharge: 2017-01-30 | Disposition: A | Discharge status: Home / Self Care | Payer: Medicare Other | Source: Ambulatory Visit | Attending: Internal Medicine | Admitting: Internal Medicine

## 2017-01-30 DIAGNOSIS — Z1231 Encounter for screening mammogram for malignant neoplasm of breast: Secondary | ICD-10-CM | POA: Insufficient documentation

## 2018-05-11 ENCOUNTER — Emergency Department
Admission: EM | Admit: 2018-05-11 | Discharge: 2018-05-11 | Disposition: A | Discharge status: Home / Self Care | Payer: Medicare Other | Attending: Emergency Medicine | Admitting: Emergency Medicine

## 2018-05-11 ENCOUNTER — Encounter: Payer: Self-pay | Admitting: Emergency Medicine

## 2018-05-11 ENCOUNTER — Other Ambulatory Visit: Payer: Self-pay

## 2018-05-11 DIAGNOSIS — Z5321 Procedure and treatment not carried out due to patient leaving prior to being seen by health care provider: Secondary | ICD-10-CM | POA: Insufficient documentation

## 2018-05-11 DIAGNOSIS — R030 Elevated blood-pressure reading, without diagnosis of hypertension: Secondary | ICD-10-CM | POA: Diagnosis not present

## 2018-05-11 NOTE — ED Triage Notes (Signed)
PT sent from eye Dr. 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 Hx of HTN and takes lisinopril, took this am. PT denies headache. A&OX4, ambulatory

## 2018-05-11 NOTE — ED Notes (Signed)
Patient approached the desk and is requesting to leave. 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 She states she is feeling better but would rather go home and lie down. Patient encouraged to stay to be seen but insists she would like to go home. Vitals rechecked and she states they "are better than they were and it's time for my blood pressure medicine anyway." Patient educated on changes in condition that she should return to the ED immediately.

## 2019-09-01 ENCOUNTER — Ambulatory Visit: Payer: Medicare Other | Attending: Internal Medicine

## 2019-09-01 DIAGNOSIS — Z23 Encounter for immunization: Secondary | ICD-10-CM | POA: Insufficient documentation

## 2019-09-01 NOTE — Progress Notes (Signed)
   Covid-19 Vaccination Clinic  Name:  Suzanne Francis    MRN: [id number] DOB: Apr 15, 1941  09/01/2019  [name] was observed post Covid-19 immunization for 15 minutes without incidence. She was provided with Vaccine Information Sheet and instruction to access the V-Safe system.   [name] was instructed to call 911 with any severe reactions post vaccine: Marland Kitchen Difficulty breathing  . Swelling of your face and throat  . A fast heartbeat  . A bad rash all over your body  . Dizziness and weakness    Immunizations Administered    Name Date Dose VIS Date Route   Pfizer COVID-19 Vaccine 09/01/2019  1:56 PM 0.3 mL 06/14/2019 Intramuscular   Manufacturer: [employer], [employer]   Lot: [id number]   NDC: [address]-5

## 2019-10-01 ENCOUNTER — Ambulatory Visit: Payer: Medicare Other | Attending: Internal Medicine

## 2019-10-01 DIAGNOSIS — Z23 Encounter for immunization: Secondary | ICD-10-CM

## 2019-10-01 NOTE — Progress Notes (Signed)
   Covid-19 Vaccination Clinic  Name:  Suzanne Francis    MRN: [id number] DOB: [birth year]  10/01/2019  Suzanne Francis was observed post Covid-19 immunization for 30 minutes based on pre-vaccination screening without incident. She was provided with Vaccine Information Sheet and instruction to access the V-Safe system.   Suzanne Francis was instructed to call 911 with any severe reactions post vaccine: Marland Kitchen Difficulty breathing  . Swelling of face and throat  . A fast heartbeat  . A bad rash all over body  . Dizziness and weakness   Immunizations Administered    Name Date Dose VIS Date Route   Pfizer COVID-19 Vaccine 10/01/2019  4:45 PM 0.3 mL 06/14/2019 Intramuscular   Manufacturer: [employer], [employer]   Lot: [id number]   NDC: [address]-4

## 2019-12-18 ENCOUNTER — Inpatient Hospital Stay: Payer: Medicare Other

## 2019-12-18 ENCOUNTER — Encounter: Payer: Self-pay | Admitting: Internal Medicine

## 2019-12-18 ENCOUNTER — Inpatient Hospital Stay: Payer: Medicare Other | Attending: Internal Medicine | Admitting: Internal Medicine

## 2019-12-18 ENCOUNTER — Other Ambulatory Visit: Payer: Self-pay

## 2019-12-18 DIAGNOSIS — H348132 Central retinal vein occlusion, bilateral, stable: Secondary | ICD-10-CM | POA: Diagnosis present

## 2019-12-18 DIAGNOSIS — Z87891 Personal history of nicotine dependence: Secondary | ICD-10-CM | POA: Diagnosis not present

## 2019-12-18 DIAGNOSIS — I2510 Atherosclerotic heart disease of native coronary artery without angina pectoris: Secondary | ICD-10-CM | POA: Diagnosis not present

## 2019-12-18 DIAGNOSIS — N189 Chronic kidney disease, unspecified: Secondary | ICD-10-CM | POA: Insufficient documentation

## 2019-12-18 DIAGNOSIS — E1122 Type 2 diabetes mellitus with diabetic chronic kidney disease: Secondary | ICD-10-CM | POA: Diagnosis not present

## 2019-12-18 NOTE — Progress Notes (Signed)
[facility] CONSULT NOTE  Patient Care Team: Lynnea Ferrier, MD as PCP - General (Internal Medicine)  CHIEF COMPLAINTS/PURPOSE OF CONSULTATION: bil rentinal vein occlusion; positive anti-cardiolipin antibody  # Bilateral central Vein occlusion- [[facility]; Dr.Kishan Govind- [phone]]  # CAD; CKD; DM-2 Oncology History   No history exists.     HISTORY OF PRESENTING ILLNESS:  Suzanne Francis 79 y.o.  female history of central retinal vein occlusion has been referred to [place] for further evaluation recommendations for abnormal antiphospholipid antibody profile.  Patient states that she had been following up with retina specialist for a long time.  However given worsening impaired vision patient-had work-up for hypercoagulable state by her ophthalmologist that was concerning for anticardiolipin antibodies.  Patient denies any prior history of DVT PEs.  Patient has not been on anticoagulation.    Review of Systems  Constitutional: Negative for chills, diaphoresis, fever, malaise/fatigue and weight loss.  HENT: Negative for nosebleeds and sore throat.   Eyes: Negative for double vision.  Respiratory: Negative for cough, hemoptysis, sputum production, shortness of breath and wheezing.   Cardiovascular: Negative for chest pain, palpitations, orthopnea and leg swelling.  Gastrointestinal: Negative for abdominal pain, blood in stool, constipation, diarrhea, heartburn, melena, nausea and vomiting.  Genitourinary: Negative for dysuria, frequency and urgency.  Musculoskeletal: Negative for back pain and joint pain.  Skin: Negative.  Negative for itching and rash.  Neurological: Negative for dizziness, tingling, focal weakness, weakness and headaches.  Endo/Heme/Allergies: Does not bruise/bleed easily.  Psychiatric/Behavioral: Negative for depression. The patient is not nervous/anxious and does not have insomnia.      MEDICAL HISTORY:  Past Medical History:   Diagnosis Date  . Arthritis   . CKD (chronic kidney disease)   . Coronary artery disease   . Diabetes mellitus without complication (HCC)   . GERD (gastroesophageal reflux disease)   . Hypertension   . OSA (obstructive sleep apnea)   . Thyroid disease    hypothyroidism  . Vitamin D deficiency     SURGICAL HISTORY: Past Surgical History:  Procedure Laterality Date  . CARDIAC CATHETERIZATION N/A 03/25/2015   Procedure: Left Heart Cath and Coronary Angiography;  Surgeon: Dalia Heading, MD;  Location: [facility] INVASIVE CV LAB;  Service: Cardiovascular;  Laterality: N/A;  . CARDIAC CATHETERIZATION N/A 03/25/2015   Procedure: Coronary Stent Intervention;  Surgeon: Marykay Lex, MD;  Location: [facility] INVASIVE CV LAB;  Service: Cardiovascular;  Laterality: N/A;  . CARDIAC CATHETERIZATION N/A 12/11/2015   Procedure: Right/Left Heart Cath and Coronary Angiography;  Surgeon: Dalia Heading, MD;  Location: [facility] INVASIVE CV LAB;  Service: Cardiovascular;  Laterality: N/A;  . CHOLECYSTECTOMY    . CORONARY ANGIOPLASTY    . TONSILLECTOMY      SOCIAL HISTORY: Social History   Socioeconomic History  . Marital status: Widowed    Spouse name: Not on file  . Number of children: Not on file  . Years of education: Not on file  . Highest education level: Not on file  Occupational History  . Not on file  Tobacco Use  . Smoking status: Former Smoker    Quit date: 03/24/1975    Years since quitting: 44.7  . Smokeless tobacco: Never Used  Substance and Sexual Activity  . Alcohol use: Yes    Alcohol/week: 0.0 standard drinks    Comment: glass of wine occasionally  . Drug use: No  . Sexual activity: Not on file  Other Topics Concern  . Not on  file  Social History Narrative   Lives at home by herself; quit smoking in early 59s; drink wine; used to work bank.    Social Determinants of Health   Financial Resource Strain:   . Difficulty of Paying Living Expenses:   Food Insecurity:   . Worried  About Charity fundraiser in the Last Year:   . Arboriculturist in the Last Year:   Transportation Needs:   . Film/video editor (Medical):   Marland Kitchen Lack of Transportation (Non-Medical):   Physical Activity:   . Days of Exercise per Week:   . Minutes of Exercise per Session:   Stress:   . Feeling of Stress :   Social Connections:   . Frequency of Communication with Friends and Family:   . Frequency of Social Gatherings with Friends and Family:   . Attends Religious Services:   . Active Member of Clubs or Organizations:   . Attends Archivist Meetings:   Marland Kitchen Marital Status:   Intimate Partner Violence:   . Fear of Current or Ex-Partner:   . Emotionally Abused:   Marland Kitchen Physically Abused:   . Sexually Abused:     FAMILY HISTORY: Family History  Problem Relation Age of Onset  . CVA Father   . CAD Mother   . Breast cancer Maternal Aunt     ALLERGIES:  is allergic to penicillins.  MEDICATIONS:  Current Outpatient Medications  Medication Sig Dispense Refill  . aspirin EC 81 MG EC tablet Take 1 tablet (81 mg total) by mouth daily. 30 tablet 11  . colchicine 0.6 MG tablet Take 0.6-1.2 mg by mouth as needed (for gout flares).    Marland Kitchen escitalopram (LEXAPRO) 10 MG tablet Take 10 mg by mouth daily.    Marland Kitchen glipiZIDE (GLUCOTROL XL) 10 MG 24 hr tablet Take 10 mg by mouth daily.    Marland Kitchen levothyroxine (SYNTHROID, LEVOTHROID) 125 MCG tablet Take 125 mcg by mouth daily before breakfast.    . lisinopril (PRINIVIL,ZESTRIL) 40 MG tablet Take 40 mg by mouth daily.    . metFORMIN (GLUCOPHAGE) 500 MG tablet Take 500 mg by mouth 2 (two) times daily with a meal. Reported on 12/11/2015    . metoprolol tartrate (LOPRESSOR) 25 MG tablet Take 1 tablet (25 mg total) by mouth 2 (two) times daily. 60 tablet 11  . nitroGLYCERIN (NITROSTAT) 0.4 MG SL tablet Place 1 tablet (0.4 mg total) under the tongue every 5 (five) minutes as needed for chest pain. 20 tablet 12  . pioglitazone (ACTOS) 30 MG tablet Take 30 mg by  mouth daily.    . rosuvastatin (CRESTOR) 20 MG tablet Take 1 tablet (20 mg total) by mouth daily at 6 PM. 30 tablet 11  . ticagrelor (BRILINTA) 90 MG TABS tablet Take 1 tablet (90 mg total) by mouth 2 (two) times daily. 60 tablet 11   No current facility-administered medications for this visit.      Marland Kitchen  PHYSICAL EXAMINATION:  Vitals:   12/18/19 1414  BP: 132/62  Pulse: (!) 55  Temp: (!) 97 F (36.1 C)   Filed Weights   12/18/19 1414  Weight: 198 lb (89.8 kg)    Physical Exam HENT:     Head: Normocephalic and atraumatic.     Mouth/Throat:     Pharynx: No oropharyngeal exudate.  Eyes:     Pupils: Pupils are equal, round, and reactive to light.  Cardiovascular:     Rate and Rhythm: Normal rate and regular  rhythm.  Pulmonary:     Effort: Pulmonary effort is normal. No respiratory distress.     Breath sounds: Normal breath sounds. No wheezing.  Abdominal:     General: Bowel sounds are normal. There is no distension.     Palpations: Abdomen is soft. There is no mass.     Tenderness: There is no abdominal tenderness. There is no guarding or rebound.  Musculoskeletal:        General: No tenderness. Normal range of motion.     Cervical back: Normal range of motion and neck supple.  Skin:    General: Skin is warm.  Neurological:     Mental Status: She is alert and oriented to person, place, and time.  Psychiatric:        Mood and Affect: Affect normal.      LABORATORY DATA:  I have reviewed the data as listed Lab Results  Component Value Date   WBC 6.8 03/26/2015   HGB 11.8 (L) 03/26/2015   HCT 34.5 (L) 03/26/2015   MCV 96.6 03/26/2015   PLT 196 03/26/2015   No results for input(s): NA, K, CL, CO2, GLUCOSE, BUN, CREATININE, CALCIUM, GFRNONAA, GFRAA, PROT, ALBUMIN, AST, ALT, ALKPHOS, BILITOT, BILIDIR, IBILI in the last 8760 hours.  RADIOGRAPHIC STUDIES: I have personally reviewed the radiological images as listed and agreed with the findings in the report. No  results found.  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  Awaiting records from ophthalmologist office.  #Discussed multiple causes of central venous occlusions including vascular causes [most common]; less likely cause of primary hypercoagulable state [especially with bilateral].  Discussed with patient that she would benefit from anticoagulation with NOAKs if it was deemed that her bilateral central retinal vein occlusions were indeed from hypercoagulable state.  Also discussed that we would repeat antiphospholipid antibody panel in about 12 weeks to confirm diagnosis.  #Also discussed above plan of care with patient's ophthalmologist Dr. Lelan Pons.   Thank you Dr.Klein for allowing me to participate in the care of your pleasant patient. Please do not hesitate to contact me with questions or concerns in the interim.  # DISPOSITION: We will call once work-up available to review/regarding the next plan of care.   # follow up TBD-Dr.B  Addendum:labs- may 17th [opthalmologist] APTT 27.5; PT 10.5; DRV VT 34.1; anxiety cardiolipin antibody IgG less than 9; beta-2 glycoprotein IgG IgM less than 9; anticardiolipin IgM 15 [normal less than 12]-slightly abnormal/indeterminate.  Protein S-total; free; functional-normal; protein C-antigen/-function normal; homocystine normal; Antithrombin III-activity/antigen-normal; hemoglobin 13 white count 4.7 platelets 228.  Review of her labs is not concerning for antiphospholipid antibody panel.  However, for confirmation recommend repeating the labs again in 3 months.  Patient will be informed.   Cc; Dr.Klein/Dr.Govind.      All questions were answered. The patient knows to call the clinic with any problems, questions or concerns.    Earna Coder, MD 12/24/2019 3:10 PM

## 2019-12-18 NOTE — Assessment & Plan Note (Addendum)
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  Awaiting records from ophthalmologist office.  #Discussed multiple causes of central venous occlusions including vascular causes [most common]; less likely cause of primary hypercoagulable state [especially with bilateral].  Discussed with patient that she would benefit from anticoagulation with NOAKs if it was deemed that her bilateral central retinal vein occlusions were indeed from hypercoagulable state.  Also discussed that we would repeat antiphospholipid antibody panel in about 12 weeks to confirm diagnosis.  #Also discussed above plan of care with patient's ophthalmologist Dr. Lelan Pons.   Thank you Dr.Klein for allowing me to participate in the care of your pleasant patient. Please do not hesitate to contact me with questions or concerns in the interim.  # DISPOSITION: We will call once work-up available to review/regarding the next plan of care.   # follow up TBD-Dr.B  Addendum:labs- may 17th [opthalmologist] APTT 27.5; PT 10.5; DRV VT 34.1; anxiety cardiolipin antibody IgG less than 9; beta-2 glycoprotein IgG IgM less than 9; anticardiolipin IgM 15 [normal less than 12]-slightly abnormal/indeterminate.  Protein S-total; free; functional-normal; protein C-antigen/-function normal; homocystine normal; Antithrombin III-activity/antigen-normal; hemoglobin 13 white count 4.7 platelets 228.  Review of her labs is not concerning for antiphospholipid antibody panel.  However, for confirmation recommend repeating the labs again in 3 months.  Patient will be informed.   Cc; Dr.Klein/Dr.Govind.

## 2019-12-25 ENCOUNTER — Telehealth: Payer: Self-pay | Admitting: Internal Medicine

## 2019-12-25 DIAGNOSIS — H348132 Central retinal vein occlusion, bilateral, stable: Secondary | ICD-10-CM

## 2019-12-25 NOTE — Telephone Encounter (Signed)
On 6/22- I Spoke to pt re: results of the APS from opthlamologist office- quite unremarkable. Low suspicion for APS. Would recommend repeating labs.  Pt in agreement.  C- please schedule follow up in last week of aug 2021; Labs  [ordered]- 2 week prior- GB

## 2020-01-28 ENCOUNTER — Emergency Department: Payer: Medicare Other

## 2020-01-28 ENCOUNTER — Other Ambulatory Visit: Payer: Self-pay

## 2020-01-28 ENCOUNTER — Observation Stay
Admission: EM | Admit: 2020-01-28 | Discharge: 2020-01-29 | Disposition: A | Discharge status: Home / Self Care | Payer: Medicare Other | Attending: Internal Medicine | Admitting: Internal Medicine

## 2020-01-28 ENCOUNTER — Encounter: Payer: Self-pay | Admitting: Emergency Medicine

## 2020-01-28 DIAGNOSIS — E1169 Type 2 diabetes mellitus with other specified complication: Secondary | ICD-10-CM | POA: Insufficient documentation

## 2020-01-28 DIAGNOSIS — Z20822 Contact with and (suspected) exposure to covid-19: Secondary | ICD-10-CM | POA: Diagnosis not present

## 2020-01-28 DIAGNOSIS — E039 Hypothyroidism, unspecified: Secondary | ICD-10-CM | POA: Diagnosis not present

## 2020-01-28 DIAGNOSIS — F419 Anxiety disorder, unspecified: Secondary | ICD-10-CM

## 2020-01-28 DIAGNOSIS — I129 Hypertensive chronic kidney disease with stage 1 through stage 4 chronic kidney disease, or unspecified chronic kidney disease: Secondary | ICD-10-CM | POA: Insufficient documentation

## 2020-01-28 DIAGNOSIS — E1122 Type 2 diabetes mellitus with diabetic chronic kidney disease: Secondary | ICD-10-CM | POA: Diagnosis not present

## 2020-01-28 DIAGNOSIS — E876 Hypokalemia: Secondary | ICD-10-CM | POA: Diagnosis not present

## 2020-01-28 DIAGNOSIS — Z87891 Personal history of nicotine dependence: Secondary | ICD-10-CM | POA: Insufficient documentation

## 2020-01-28 DIAGNOSIS — R079 Chest pain, unspecified: Secondary | ICD-10-CM | POA: Diagnosis not present

## 2020-01-28 DIAGNOSIS — Z79899 Other long term (current) drug therapy: Secondary | ICD-10-CM | POA: Insufficient documentation

## 2020-01-28 DIAGNOSIS — N189 Chronic kidney disease, unspecified: Secondary | ICD-10-CM

## 2020-01-28 DIAGNOSIS — I119 Hypertensive heart disease without heart failure: Secondary | ICD-10-CM | POA: Insufficient documentation

## 2020-01-28 DIAGNOSIS — R748 Abnormal levels of other serum enzymes: Secondary | ICD-10-CM | POA: Insufficient documentation

## 2020-01-28 DIAGNOSIS — Z7982 Long term (current) use of aspirin: Secondary | ICD-10-CM | POA: Diagnosis not present

## 2020-01-28 DIAGNOSIS — N1831 Chronic kidney disease, stage 3a: Secondary | ICD-10-CM | POA: Insufficient documentation

## 2020-01-28 DIAGNOSIS — I25110 Atherosclerotic heart disease of native coronary artery with unstable angina pectoris: Secondary | ICD-10-CM | POA: Insufficient documentation

## 2020-01-28 DIAGNOSIS — Z7984 Long term (current) use of oral hypoglycemic drugs: Secondary | ICD-10-CM | POA: Diagnosis not present

## 2020-01-28 DIAGNOSIS — F329 Major depressive disorder, single episode, unspecified: Secondary | ICD-10-CM

## 2020-01-28 DIAGNOSIS — E785 Hyperlipidemia, unspecified: Secondary | ICD-10-CM

## 2020-01-28 DIAGNOSIS — I10 Essential (primary) hypertension: Secondary | ICD-10-CM | POA: Insufficient documentation

## 2020-01-28 DIAGNOSIS — R55 Syncope and collapse: Secondary | ICD-10-CM | POA: Insufficient documentation

## 2020-01-28 DIAGNOSIS — F32A Depression, unspecified: Secondary | ICD-10-CM | POA: Insufficient documentation

## 2020-01-28 DIAGNOSIS — N179 Acute kidney failure, unspecified: Secondary | ICD-10-CM

## 2020-01-28 HISTORY — DX: Disorder of kidney and ureter, unspecified: N28.9

## 2020-01-28 HISTORY — DX: Hemophthalmos, bilateral: H44.813

## 2020-01-28 LAB — CBC
HCT: 39.6 % (ref 36.0–46.0)
Hemoglobin: 13.5 g/dL (ref 12.0–15.0)
MCH: 33.3 pg (ref 26.0–34.0)
MCHC: 34.1 g/dL (ref 30.0–36.0)
MCV: 97.8 fL (ref 80.0–100.0)
Platelets: 218 10*3/uL (ref 150–400)
RBC: 4.05 MIL/uL (ref 3.87–5.11)
RDW: 13.1 % (ref 11.5–15.5)
WBC: 5.9 10*3/uL (ref 4.0–10.5)
nRBC: 0 % (ref 0.0–0.2)

## 2020-01-28 LAB — BASIC METABOLIC PANEL
Anion gap: 7 (ref 5–15)
BUN: 22 mg/dL (ref 8–23)
CO2: 30 mmol/L (ref 22–32)
Calcium: 9.2 mg/dL (ref 8.9–10.3)
Chloride: 101 mmol/L (ref 98–111)
Creatinine, Ser: 1.46 mg/dL — ABNORMAL HIGH (ref 0.44–1.00)
GFR calc Af Amer: 40 mL/min — ABNORMAL LOW (ref >60)
GFR calc non Af Amer: 34 mL/min — ABNORMAL LOW (ref >60)
Glucose, Bld: 220 mg/dL — ABNORMAL HIGH (ref 70–99)
Potassium: 3.5 mmol/L (ref 3.5–5.1)
Sodium: 138 mmol/L (ref 135–145)

## 2020-01-28 LAB — URINALYSIS, COMPLETE (UACMP) WITH MICROSCOPIC
Bilirubin Urine: NEGATIVE
Glucose, UA: NEGATIVE mg/dL
Hgb urine dipstick: NEGATIVE
Ketones, ur: NEGATIVE mg/dL
Nitrite: NEGATIVE
Protein, ur: NEGATIVE mg/dL
Specific Gravity, Urine: >1.046 — ABNORMAL HIGH (ref 1.005–1.030)
pH: 5 (ref 5.0–8.0)

## 2020-01-28 LAB — TROPONIN I (HIGH SENSITIVITY)
Troponin I (High Sensitivity): 90 ng/L — ABNORMAL HIGH (ref <18)
Troponin I (High Sensitivity): 87 ng/L — ABNORMAL HIGH (ref <18)
Troponin I (High Sensitivity): 75 ng/L — ABNORMAL HIGH (ref <18)

## 2020-01-28 LAB — CK: Total CK: 55 U/L (ref 38–234)

## 2020-01-28 LAB — APTT: aPTT: 30 seconds (ref 24–36)

## 2020-01-28 LAB — BRAIN NATRIURETIC PEPTIDE: B Natriuretic Peptide: 144.3 pg/mL — ABNORMAL HIGH (ref 0.0–100.0)

## 2020-01-28 LAB — GLUCOSE, CAPILLARY: Glucose-Capillary: 145 mg/dL — ABNORMAL HIGH (ref 70–99)

## 2020-01-28 LAB — SARS CORONAVIRUS 2 BY RT PCR (HOSPITAL ORDER, PERFORMED IN ~~LOC~~ HOSPITAL LAB): SARS Coronavirus 2: NEGATIVE

## 2020-01-28 LAB — PROTIME-INR
INR: 1 (ref 0.8–1.2)
Prothrombin Time: 13 seconds (ref 11.4–15.2)

## 2020-01-28 MED ORDER — ROSUVASTATIN CALCIUM 10 MG PO TABS
20.0000 mg | ORAL_TABLET | Freq: Every day | ORAL | Status: DC
  Administered 2020-01-28: 20 mg via ORAL
  Filled 2020-01-28: qty 2
  Filled 2020-01-28: qty 1

## 2020-01-28 MED ORDER — ASPIRIN EC 81 MG PO TBEC
81.0000 mg | DELAYED_RELEASE_TABLET | Freq: Every day | ORAL | Status: DC
  Filled 2020-01-28: qty 1

## 2020-01-28 MED ORDER — MELATONIN 5 MG PO TABS
5.0000 mg | ORAL_TABLET | Freq: Every day | ORAL | Status: DC
  Administered 2020-01-28: 5 mg via ORAL
  Filled 2020-01-28: qty 1

## 2020-01-28 MED ORDER — SODIUM CHLORIDE 0.9 % IV BOLUS
250.0000 mL | Freq: Once | INTRAVENOUS | Status: AC
  Administered 2020-01-28: 250 mL via INTRAVENOUS

## 2020-01-28 MED ORDER — METOPROLOL TARTRATE 25 MG PO TABS
12.5000 mg | ORAL_TABLET | Freq: Two times a day (BID) | ORAL | Status: DC
  Filled 2020-01-28: qty 1

## 2020-01-28 MED ORDER — TICAGRELOR 90 MG PO TABS
90.0000 mg | ORAL_TABLET | Freq: Two times a day (BID) | ORAL | Status: DC
  Administered 2020-01-28: 90 mg via ORAL
  Filled 2020-01-28: qty 1

## 2020-01-28 MED ORDER — ACETAMINOPHEN 650 MG RE SUPP: 650.0000 mg | Freq: Four times a day (QID) | RECTAL | Status: DC | PRN

## 2020-01-28 MED ORDER — IOHEXOL 350 MG/ML SOLN
60.0000 mL | Freq: Once | INTRAVENOUS | Status: AC | PRN
  Administered 2020-01-28: 60 mL via INTRAVENOUS

## 2020-01-28 MED ORDER — ONDANSETRON HCL 4 MG PO TABS: 4.0000 mg | ORAL_TABLET | Freq: Four times a day (QID) | ORAL | Status: DC | PRN

## 2020-01-28 MED ORDER — ESCITALOPRAM OXALATE 10 MG PO TABS
10.0000 mg | ORAL_TABLET | Freq: Every day | ORAL | Status: DC
  Administered 2020-01-29: 10 mg via ORAL
  Filled 2020-01-28: qty 1

## 2020-01-28 MED ORDER — HEPARIN BOLUS VIA INFUSION
3800.0000 [IU] | Freq: Once | INTRAVENOUS | Status: DC
  Filled 2020-01-28: qty 3800

## 2020-01-28 MED ORDER — ACETAMINOPHEN 325 MG PO TABS: 650.0000 mg | ORAL_TABLET | Freq: Four times a day (QID) | ORAL | Status: DC | PRN

## 2020-01-28 MED ORDER — LEVOTHYROXINE SODIUM 25 MCG PO TABS
125.0000 ug | ORAL_TABLET | Freq: Every day | ORAL | Status: DC
  Administered 2020-01-29: 125 ug via ORAL
  Filled 2020-01-28: qty 1

## 2020-01-28 MED ORDER — NITROGLYCERIN 2 % TD OINT
0.5000 [in_us] | TOPICAL_OINTMENT | Freq: Once | TRANSDERMAL | Status: AC
  Administered 2020-01-28: 0.5 [in_us] via TOPICAL
  Filled 2020-01-28: qty 1

## 2020-01-28 MED ORDER — INSULIN ASPART 100 UNIT/ML ~~LOC~~ SOLN: 0.0000 [IU] | Freq: Three times a day (TID) | SUBCUTANEOUS | Status: DC

## 2020-01-28 MED ORDER — ONDANSETRON HCL 4 MG/2ML IJ SOLN: 4.0000 mg | Freq: Four times a day (QID) | INTRAMUSCULAR | Status: DC | PRN

## 2020-01-28 MED ORDER — ASPIRIN 81 MG PO CHEW
324.0000 mg | CHEWABLE_TABLET | Freq: Once | ORAL | Status: AC
  Administered 2020-01-28: 324 mg via ORAL
  Filled 2020-01-28: qty 4

## 2020-01-28 MED ORDER — HEPARIN (PORCINE) 25000 UT/250ML-% IV SOLN
900.0000 [IU]/h | INTRAVENOUS | Status: DC
  Filled 2020-01-28: qty 250

## 2020-01-28 MED ORDER — NITROGLYCERIN 0.4 MG SL SUBL: 0.4000 mg | SUBLINGUAL_TABLET | SUBLINGUAL | Status: DC | PRN

## 2020-01-28 MED ORDER — SODIUM CHLORIDE 0.9 % IV SOLN: INTRAVENOUS | Status: DC

## 2020-01-28 MED ORDER — INSULIN ASPART 100 UNIT/ML ~~LOC~~ SOLN: 0.0000 [IU] | Freq: Every day | SUBCUTANEOUS | Status: DC

## 2020-01-28 NOTE — Consult Note (Addendum)
ANTICOAGULATION CONSULT NOTE   Pharmacy Consult for Heparin Indication: chest pain/ACS  Allergies  Allergen Reactions  . Penicillins Hives and Other (See Comments)    Has patient had a PCN reaction causing immediate rash, facial/tongue/throat swelling, SOB or lightheadedness with hypotension: No Has patient had a PCN reaction causing severe rash involving mucus membranes or skin necrosis: No Has patient had a PCN reaction that required hospitalization No Has patient had a PCN reaction occurring within the last 10 years: Yes If all of the above answers are "NO", then may proceed with Cephalosporin use.    Patient Measurements: Height: 4\' 11"  (149.9 cm) Weight: 88 kg (194 lb) IBW/kg (Calculated) : 43.2 Heparin Dosing Weight: 64.2 kg  Vital Signs: Temp: 98.7 F (37.1 C) (07/27 1520) Temp Source: Oral (07/27 1520) BP: 152/60 (07/27 1520) Pulse Rate: 50 (07/27 1520)  Labs: Recent Labs    01/28/20 1311 01/28/20 1520  HGB 13.5  --   HCT 39.6  --   PLT 218  --   CREATININE 1.46*  --   TROPONINIHS 90* 87*    Estimated Creatinine Clearance: 30.6 mL/min (A) (by C-G formula based on SCr of 1.46 mg/dL (H)).   Medical History: Past Medical History:  Diagnosis Date  . Arthritis   . CKD (chronic kidney disease)   . Coronary artery disease   . Diabetes mellitus without complication (HCC)   . GERD (gastroesophageal reflux disease)   . Hypertension   . OSA (obstructive sleep apnea)   . Thyroid disease    hypothyroidism  . Vitamin D deficiency     Medications:  (Not in a hospital admission)  Scheduled:  . aspirin  324 mg Oral Once   Infusions:  . sodium chloride     PRN:  Anti-infectives (From admission, onward)   None      Assessment: Pharmacy consulted for heparin for ACS. No DOAC PTA. Trop slightly elevated at 87.   Goal of Therapy:  Heparin level 0.3-0.7 units/ml Monitor platelets by anticoagulation protocol: Yes   Plan:  Give 3800 units bolus x  1 Start heparin infusion at 900 units/hr Check anti-Xa level in 6 hours and daily while on heparin Continue to monitor H&H and platelets  01/30/20, PharmD, BCPS 01/28/2020,4:35 PM

## 2020-01-28 NOTE — Plan of Care (Signed)
  Problem: Education: Goal: Knowledge of General Education information will improve Description: Including pain rating scale, medication(s)/side effects and non-pharmacologic comfort measures Outcome: Progressing Note: Patient admitted to room 235. No complaints of pain. Patient admits to having some dizziness. Explained to patient about safety precautions. Patient profile completed.

## 2020-01-28 NOTE — ED Provider Notes (Signed)
[facility] Emergency Department Provider Note    First MD Initiated Contact with Patient 01/28/20 1617     (approximate)  I have reviewed the triage vital signs and the nursing notes.   HISTORY  Chief Complaint Near Syncope    HPI Suzanne Francis is a 79 y.o. female with the below's past medical history presents to the ER for evaluation of midsternal nonradiating chest pain as well as near syncopal event.  Witnessed to have episode where her lips turned blue this morning.  Was diaphoretic and ill-appearing.  Reportedly has had some decreased p.o. intake.  Not any blood thinners.  Does have some shortness of breath.  Does not wear home oxygen.  Not on any anticoagulation.  Does have a history of CAD status post stent.    Past Medical History:  Diagnosis Date  . Arthritis   . CKD (chronic kidney disease)   . Coronary artery disease   . Diabetes mellitus without complication (HCC)   . GERD (gastroesophageal reflux disease)   . Hypertension   . OSA (obstructive sleep apnea)   . Renal insufficiency   . Thyroid disease    hypothyroidism  . Vitamin D deficiency    Family History  Problem Relation Age of Onset  . CVA Father   . CAD Mother   . Breast cancer Maternal Aunt    Past Surgical History:  Procedure Laterality Date  . CARDIAC CATHETERIZATION N/A 03/25/2015   Procedure: Left Heart Cath and Coronary Angiography;  Surgeon: Dalia HeadingKenneth A Fath, MD;  Location: [facility] INVASIVE CV LAB;  Service: Cardiovascular;  Laterality: N/A;  . CARDIAC CATHETERIZATION N/A 03/25/2015   Procedure: Coronary Stent Intervention;  Surgeon: Marykay Lexavid W Harding, MD;  [facility];  Service: Cardiovascular;  Laterality: N/A;  . CARDIAC CATHETERIZATION N/A 12/11/2015   Procedure: Right/Left Heart Cath and Coronary Angiography;  Surgeon: Dalia HeadingKenneth A Fath, MD;  Location: [facility] INVASIVE CV LAB;  Service: Cardiovascular;  Laterality: N/A;  . CHOLECYSTECTOMY    . CORONARY  ANGIOPLASTY    . TONSILLECTOMY     Patient Active Problem List   Diagnosis Date Noted  . Chest pain 01/28/2020  . 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 Coronary artery disease involving native coronary artery of native heart with unstable angina pectoris (HCC)   . NSTEMI (non-ST elevated myocardial infarction) (HCC) 03/24/2015      Prior to Admission medications   Medication Sig Start Date End Date Taking? Authorizing Provider  aspirin EC 81 MG EC tablet Take 1 tablet (81 mg total) by mouth daily. 03/27/15   Curtis SitesKlein, Bert J III, MD  colchicine 0.6 MG tablet Take 0.6-1.2 mg by mouth as needed (for gout flares).    [provider]  escitalopram (LEXAPRO) 10 MG tablet Take 10 mg by mouth daily.    [provider]  glipiZIDE (GLUCOTROL XL) 10 MG 24 hr tablet Take 10 mg by mouth daily.    [provider]  levothyroxine (SYNTHROID, LEVOTHROID) 125 MCG tablet Take 125 mcg by mouth daily before breakfast.    [provider]  lisinopril (PRINIVIL,ZESTRIL) 40 MG tablet Take 40 mg by mouth daily.    [provider]  metFORMIN (GLUCOPHAGE) 500 MG tablet Take 500 mg by mouth 2 (two) times daily with a meal. Reported on 12/11/2015    [provider]  metoprolol tartrate (LOPRESSOR) 25 MG tablet Take 1 tablet (25 mg total) by mouth 2 (two) times daily. 03/27/15  Lynnea Ferrier, MD  nitroGLYCERIN (NITROSTAT) 0.4 MG SL tablet Place 1 tablet (0.4 mg total) under the tongue every 5 (five) minutes as needed for chest pain. 03/27/15   Curtis Sites III, MD  pioglitazone (ACTOS) 30 MG tablet Take 30 mg by mouth daily.    [provider]  rosuvastatin (CRESTOR) 20 MG tablet Take 1 tablet (20 mg total) by mouth daily at 6 PM. 03/27/15   Curtis Sites III, MD  ticagrelor (BRILINTA) 90 MG TABS tablet Take 1 tablet (90 mg total) by mouth 2 (two) times daily. 03/27/15   Lynnea Ferrier, MD    Allergies Penicillins    Social  History Social History   Tobacco Use  . Smoking status: Former Smoker    Quit date: 03/24/1975    Years since quitting: 44.8  . Smokeless tobacco: Never Used  Substance Use Topics  . Alcohol use: Yes    Alcohol/week: 0.0 standard drinks    Comment: glass of wine occasionally  . Drug use: No    Review of Systems Patient denies headaches, rhinorrhea, blurry vision, numbness, shortness of breath, chest pain, edema, cough, abdominal pain, nausea, vomiting, diarrhea, dysuria, fevers, rashes or hallucinations unless otherwise stated above in HPI. ____________________________________________   PHYSICAL EXAM:  VITAL SIGNS: Vitals:   01/28/20 1520 01/28/20 1631  BP: (!) 152/60 (!) 162/74  Pulse: 50   Resp: 18 20  Temp: 98.7 F (37.1 C)   SpO2: 100%     Constitutional: Alert and oriented.  Eyes: Conjunctivae are normal.  Head: Atraumatic. Nose: No congestion/rhinnorhea. Mouth/Throat: Mucous membranes are moist.   Neck: No stridor. Painless ROM.  Cardiovascular: Normal rate, regular rhythm. Grossly normal heart sounds.  Good peripheral circulation. Respiratory: Normal respiratory effort.  No retractions. Lungs CTAB. Gastrointestinal: Soft and nontender. No distention. No abdominal bruits. No CVA tenderness. Genitourinary:  Musculoskeletal: No lower extremity tenderness nor edema.  No joint effusions. Neurologic:  Normal speech and language. No gross focal neurologic deficits are appreciated. No facial droop Skin:  Skin is warm, dry and intact. No rash noted. Psychiatric: Mood and affect are normal. Speech and behavior are normal.  ____________________________________________   LABS (all labs ordered are listed, but only abnormal results are displayed)  Results for orders placed or performed during the hospital encounter of 01/28/20 (from the past 24 hour(s))  Basic metabolic panel     Status: Abnormal   Collection Time: 01/28/20  1:11 PM  Result Value Ref Range   Sodium  138 135 - 145 mmol/L   Potassium 3.5 3.5 - 5.1 mmol/L   Chloride 101 98 - 111 mmol/L   CO2 30 22 - 32 mmol/L   Glucose, Bld 220 (H) 70 - 99 mg/dL   BUN 22 8 - 23 mg/dL   Creatinine, Ser 2.68 (H) 0.44 - 1.00 mg/dL   Calcium 9.2 8.9 - 34.1 mg/dL   GFR calc non Af Amer 34 (L) >60 mL/min   GFR calc Af Amer 40 (L) >60 mL/min   Anion gap 7 5 - 15  CBC     Status: None   Collection Time: 01/28/20  1:11 PM  Result Value Ref Range   WBC 5.9 4.0 - 10.5 K/uL   RBC 4.05 3.87 - 5.11 MIL/uL   Hemoglobin 13.5 12.0 - 15.0 g/dL   HCT 96.2 36 - 46 %   MCV 97.8 80.0 - 100.0 fL   MCH 33.3 26.0 - 34.0 pg   MCHC 34.1  30.0 - 36.0 g/dL   RDW 25.4 27.0 - 62.3 %   Platelets 218 150 - 400 K/uL   nRBC 0.0 0.0 - 0.2 %  Troponin I (High Sensitivity)     Status: Abnormal   Collection Time: 01/28/20  1:11 PM  Result Value Ref Range   Troponin I (High Sensitivity) 90 (H) <18 ng/L  Brain natriuretic peptide     Status: Abnormal   Collection Time: 01/28/20  1:11 PM  Result Value Ref Range   B Natriuretic Peptide 144.3 (H) 0.0 - 100.0 pg/mL  Troponin I (High Sensitivity)     Status: Abnormal   Collection Time: 01/28/20  3:20 PM  Result Value Ref Range   Troponin I (High Sensitivity) 87 (H) <18 ng/L   ____________________________________________  EKG My review and personal interpretation at Time: 13:08    Indication: 13:08  Rate: 60  Rhythm: sinus Axis: left Other: normal intervals, no stemi ____________________________________________  RADIOLOGY  I personally reviewed all radiographic images ordered to evaluate for the above acute complaints and reviewed radiology reports and findings.  These findings were personally discussed with the patient.  Please see medical record for radiology report.  ____________________________________________   PROCEDURES  Procedure(s) performed:  .Critical Care Performed by: Willy Eddy, MD Authorized by: Willy Eddy, MD   Critical care provider  statement:    Critical care time (minutes):  35   Critical care time was exclusive of:  Separately billable procedures and treating other patients   Critical care was necessary to treat or prevent imminent or life-threatening deterioration of the following conditions:  Cardiac failure   Critical care was time spent personally by me on the following activities:  Development of treatment plan with patient or surrogate, discussions with consultants, evaluation of patient's response to treatment, examination of patient, obtaining history from patient or surrogate, ordering and performing treatments and interventions, ordering and review of laboratory studies, ordering and review of radiographic studies, pulse oximetry, re-evaluation of patient's condition and review of old charts      Critical Care performed: yes ____________________________________________   INITIAL IMPRESSION / ASSESSMENT AND PLAN / ED COURSE  Pertinent labs & imaging results that were available during my care of the patient were reviewed by me and considered in my medical decision making (see chart for details).   DDX: ACS, pericarditis, esophagitis, boerhaaves, pe, dissection, pna, bronchitis, costochondritis   Suzanne Francis is a 79 y.o. who presents to the ED with symptoms as described above.  Patient currently nontoxic-appearing but does have elevated troponin does have known CKD and CAD not on anticoagulation.  Given her symptoms will order CTA given concern for possible PE or intrathoracic process.  Doubt dissection.  Her abdominal exam is soft and benign.  Given her troponin elevation also concerning for ACS therefore will heparinize as she not on anticoagulation will give aspirin.  No contraindications to heparin.  Clinical Course as of Jan 28 1752  Tue Jan 28, 2020  1739 CTA without any evidence of PE.  Possible component of CHF cardiomegaly.  Given her description of symptoms will heparinize give aspirin  discussed with hospitalist for admission.  Will consult cardiology.  She is currently stable and nontoxic-appearing.   [PR]    Clinical Course User Index [PR] Willy Eddy, MD    The patient was evaluated in Emergency Department today for the symptoms described in the history of present illness. He/she was evaluated in the context of the global COVID-19 pandemic, which necessitated consideration  that the patient might be at risk for infection with the SARS-CoV-2 virus that causes COVID-19. Institutional protocols and algorithms that pertain to the evaluation of patients at risk for COVID-19 are in a state of rapid change based on information released by regulatory bodies including the CDC and federal and state organizations. These policies and algorithms were followed during the patient's care in the ED.  As part of my medical decision making, I reviewed the following data within the electronic MEDICAL RECORD NUMBER Nursing notes reviewed and incorporated, Labs reviewed, notes from prior ED visits and [place] Controlled Substance Database   ____________________________________________   FINAL CLINICAL IMPRESSION(S) / ED DIAGNOSES  Final diagnoses:  Near syncope  Chest pain, unspecified type      NEW MEDICATIONS STARTED DURING THIS VISIT:  New Prescriptions   No medications on file     Note:  This document was prepared using Dragon voice recognition software and may include unintentional dictation errors.    Willy Eddy, MD 01/28/20 1753

## 2020-01-28 NOTE — ED Triage Notes (Signed)
Here for feeling like going to pass out for past couple days. Has had SHOB and some tightness in her chest.  Ambulatory to triage with steady gait.  No fever or vomiting. No diarrhea. VSS.  Unlabored, color WNL

## 2020-01-28 NOTE — H&P (Signed)
[facility]   PATIENT NAME: Suzanne Francis    MR#:  [id number]  DATE OF BIRTH:  1940/07/10  DATE OF ADMISSION:  01/28/2020  PRIMARY CARE PHYSICIAN: Lynnea Ferrier, MD   REQUESTING/REFERRING PHYSICIAN: Dr. Willy Eddy  CHIEF COMPLAINT:   Chief Complaint  Patient presents with  . Near Syncope    HISTORY OF PRESENT ILLNESS:  Suzanne Francis  is a 79 y.o. female with a known history of CAD presents to the ER after numerous episodes of feeling like she is going to pass out.  She can even be lying in her bed.  Sometimes she feels a little dehydrated.  Last night in bed she felt like something is going over her.  Tingling in her feet up to her head.  She feels lightheaded.  No room spinning.  Has to hold onto something if she standing up.  Yesterday she developed some chest tightness lasting only a few seconds at a time.  8 out of 10 intensity.  Some shortness of breath but always has this.  No diaphoresis.  No nausea or vomiting.  Nothing made her chest pain better or worse.  She just tries to rest at that time. In the ER she had 2 troponins that were borderline.  ER physician spoke with cardiology and was advised to bring the patient in for observation.  Hospitalist services were contacted for further evaluation.  Patient not currently having chest pain.  Of note the patient has had hemorrhages in her eyes and she receives eye injections monthly.  Unable to look at ophthalmology report.  Spoke with son and he was hesitant upon IV blood thinner.  Patient already on aspirin and Brilinta.  PAST MEDICAL HISTORY:   Past Medical History:  Diagnosis Date  . Arthritis   . Bleeding of eye, bilateral   . CKD (chronic kidney disease)   . Coronary artery disease   . Diabetes mellitus without complication (HCC)   . GERD (gastroesophageal reflux disease)   . Hypertension   . OSA (obstructive sleep apnea)   . Renal insufficiency   . Thyroid disease     hypothyroidism  . Vitamin D deficiency     PAST SURGICAL HISTORY:   Past Surgical History:  Procedure Laterality Date  . CARDIAC CATHETERIZATION N/A 03/25/2015   Procedure: Left Heart Cath and Coronary Angiography;  Surgeon: Dalia Heading, MD;  Location: [facility] INVASIVE CV LAB;  Service: Cardiovascular;  Laterality: N/A;  . CARDIAC CATHETERIZATION N/A 03/25/2015   Procedure: Coronary Stent Intervention;  Surgeon: Marykay Lex, MD;  Location: [facility] INVASIVE CV LAB;  Service: Cardiovascular;  Laterality: N/A;  . CARDIAC CATHETERIZATION N/A 12/11/2015   Procedure: Right/Left Heart Cath and Coronary Angiography;  Surgeon: Dalia Heading, MD;  Location: [facility] INVASIVE CV LAB;  Service: Cardiovascular;  Laterality: N/A;  . CHOLECYSTECTOMY    . CORONARY ANGIOPLASTY    . TONSILLECTOMY      SOCIAL HISTORY:   Social History   Tobacco Use  . Smoking status: Former Smoker    Quit date: 03/24/1975    Years since quitting: 44.8  . Smokeless tobacco: Never Used  Substance Use Topics  . Alcohol use: Yes    Alcohol/week: 0.0 standard drinks    Comment: glass of wine occasionally    FAMILY HISTORY:   Family History  Problem Relation Age of Onset  . CVA Father   . CAD Mother   . Alzheimer's disease Mother   .  Breast cancer Maternal Aunt     DRUG ALLERGIES:   Allergies  Allergen Reactions  . Penicillins Hives and Other (See Comments)    Has patient had a PCN reaction causing immediate rash, facial/tongue/throat swelling, SOB or lightheadedness with hypotension: No Has patient had a PCN reaction causing severe rash involving mucus membranes or skin necrosis: No Has patient had a PCN reaction that required hospitalization No Has patient had a PCN reaction occurring within the last 10 years: Yes If all of the above answers are "NO", then may proceed with Cephalosporin use.    REVIEW OF SYSTEMS:  CONSTITUTIONAL: No fever, fatigue or weakness.  EYES: Does have decreased vision and  bleeding in the back of the eyes. EARS, NOSE, AND THROAT: No tinnitus or ear pain. No sore throat RESPIRATORY: Some shortness of breath.  CARDIOVASCULAR: Occasional chest pain.no orthopnea, edema.  GASTROINTESTINAL: No nausea, vomiting, diarrhea or abdominal pain.  GENITOURINARY: No dysuria, hematuria.  ENDOCRINE: No polyuria, nocturia, positive for hypothyroidism HEMATOLOGY: No anemia, easy bruising or bleeding SKIN: No rash or lesion. MUSCULOSKELETAL: No joint pain or arthritis.   NEUROLOGIC: Positive for tingling.  PSYCHIATRY: History of anxiety and depression.   MEDICATIONS AT HOME:   Prior to Admission medications   Medication Sig Start Date End Date Taking? Authorizing Provider  aspirin EC 81 MG EC tablet Take 1 tablet (81 mg total) by mouth daily. 03/27/15   Curtis Sites III, MD  colchicine 0.6 MG tablet Take 0.6-1.2 mg by mouth as needed (for gout flares).    [provider]  escitalopram (LEXAPRO) 10 MG tablet Take 10 mg by mouth daily.    [provider]  glipiZIDE (GLUCOTROL XL) 10 MG 24 hr tablet Take 10 mg by mouth daily.    [provider]  levothyroxine (SYNTHROID, LEVOTHROID) 125 MCG tablet Take 125 mcg by mouth daily before breakfast.    [provider]  lisinopril (PRINIVIL,ZESTRIL) 40 MG tablet Take 40 mg by mouth daily.    [provider]  metoprolol tartrate (LOPRESSOR) 25 MG tablet Take 1 tablet (25 mg total) by mouth 2 (two) times daily. 03/27/15   Curtis Sites III, MD  nitroGLYCERIN (NITROSTAT) 0.4 MG SL tablet Place 1 tablet (0.4 mg total) under the tongue every 5 (five) minutes as needed for chest pain. 03/27/15   Curtis Sites III, MD  pioglitazone (ACTOS) 30 MG tablet Take 30 mg by mouth daily.    [provider]  rosuvastatin (CRESTOR) 20 MG tablet Take 1 tablet (20 mg total) by mouth daily at 6 PM. 03/27/15   Curtis Sites III, MD  ticagrelor (BRILINTA) 90 MG TABS tablet Take 1 tablet (90 mg total) by mouth 2  (two) times daily. 03/27/15   Curtis Sites III, MD      VITAL SIGNS:  Blood pressure (!) 162/74, pulse 50, temperature 98.7 F (37.1 C), temperature source Oral, resp. rate 20, height 4\' 11"  (1.499 m), weight 88 kg, SpO2 100 %.  PHYSICAL EXAMINATION:  GENERAL:  78 y.o.-year-old patient lying in the bed with no acute distress.  EYES: Pupils equal, round, reactive to light and accommodation. No scleral icterus. Extraocular muscles intact.  HEENT: Head atraumatic, normocephalic. Oropharynx and nasopharynx clear.  Tympanic membrane no erythema NECK:  Supple, no jugular venous distention. No thyroid enlargement, no tenderness.  LUNGS: Normal breath sounds bilaterally, no wheezing, rales,rhonchi or crepitation. No use of accessory muscles of respiration.  CARDIOVASCULAR: S1, S2 bradycardia. No murmurs, rubs,  or gallops.  ABDOMEN: Soft, nontender, nondistended. Bowel sounds present. No organomegaly or mass.  EXTREMITIES: No pedal edema, cyanosis, or clubbing.  NEUROLOGIC: Cranial nerves II through XII are intact. Muscle strength 5/5 in all extremities. Sensation intact. Gait not checked.  PSYCHIATRIC: The patient is alert and oriented x 3.  SKIN: No rash, lesion, or ulcer.   LABORATORY PANEL:   CBC Recent Labs  Lab 01/28/20 1311  WBC 5.9  HGB 13.5  HCT 39.6  PLT 218   ------------------------------------------------------------------------------------------------------------------  Chemistries  Recent Labs  Lab 01/28/20 1311  NA 138  K 3.5  CL 101  CO2 30  GLUCOSE 220*  BUN 22  CREATININE 1.46*  CALCIUM 9.2   ------------------------------------------------------------------------------------------------------------------  Cardiac Enzymes First high-sensitivity troponin 92 and second 87.  RADIOLOGY:  DG Chest 2 View  Result Date: 01/28/2020 CLINICAL DATA:  Cough, shortness of breath EXAM: CHEST - 2 VIEW COMPARISON:  03/24/2015 FINDINGS: Heart size is within normal  limits. Atherosclerotic calcification of the aortic knob. No focal airspace consolidation, pleural effusion, or pneumothorax. No acute osseous findings. IMPRESSION: No active cardiopulmonary disease. Electronically Signed   By: Duanne Guess D.O.   On: 01/28/2020 13:31   CT Angio Chest PE W and/or Wo Contrast  Result Date: 01/28/2020 CLINICAL DATA:  79 year old female with concern for pulmonary embolism. EXAM: CT ANGIOGRAPHY CHEST WITH CONTRAST TECHNIQUE: Multidetector CT imaging of the chest was performed using the standard protocol during bolus administration of intravenous contrast. Multiplanar CT image reconstructions and MIPs were obtained to evaluate the vascular anatomy. CONTRAST:  56mL OMNIPAQUE IOHEXOL 350 MG/ML SOLN COMPARISON:  Chest CT dated 03/24/2015. Chest radiograph dated 01/28/2020. FINDINGS: Cardiovascular: There is mild to moderate cardiomegaly. No pericardial effusion. Mild atherosclerotic calcification of the thoracic aorta. No pulmonary artery embolus identified. Mediastinum/Nodes: There is no hilar or mediastinal adenopathy. There is a small hiatal hernia. The esophagus is grossly unremarkable. Thyroidectomy. No mediastinal fluid collection. Lungs/Pleura: Mild diffuse hazy streaky density throughout the lungs, likely atelectasis. No focal consolidation, pleural effusion, or pneumothorax. The central airways are patent. Upper Abdomen: Cholecystectomy. Musculoskeletal: Degenerative changes of the spine. No acute osseous pathology. Review of the MIP images confirms the above findings. IMPRESSION: 1. No acute intrathoracic pathology. No CT evidence of pulmonary artery embolus. 2. Mild to moderate cardiomegaly. 3. Aortic Atherosclerosis (ICD10-I70.0). Electronically Signed   By: Elgie Collard M.D.   On: 01/28/2020 17:23    EKG:   Sinus bradycardia 55 bpm, flattening of T waves laterally, right bundle branch block, left axis deviation  IMPRESSION AND PLAN:   1.  Near syncopal  episodes starting with a tingling feeling that goes throughout her entire body.  We will give IV fluid hydration.  Check orthostatic vital signs.  With bradycardia I will decrease dose of metoprolol to 12.5 mg twice daily 2.  Chest pain only lasting seconds at a time.  ER physician spoke with cardiology team.  Will continue aspirin, Brilinta, lower dose metoprolol and Crestor.  Check a lipid profile in the a.m.  Cardiology consultation n.p.o. after midnight just in case stress test or cardiac catheterization.  Hesitant on heparin drip since he is not actively having chest pain at this time and only lasting a few seconds and she has a history of bleeding at the back of her eye and receiving ophthalmologic injections. 3.  Essential hypertension.  Decrease metoprolol.  Hold lisinopril with acute kidney injury. 4.  Acute kidney injury on chronic kidney disease stage IIIa.  And dehydration with  IV fluids. 5.  Type 2 diabetes with hyperlipidemia unspecified check a lipid profile tomorrow morning and continue Crestor.  Hold oral medications and put on sliding scale. 6.  Anxiety depression on Lexapro 7.  Hypothyroidism unspecified on levothyroxine   All the records, laboratory and radiological data are reviewed and case discussed with ED provider. Management plans discussed with the patient, family and they are in agreement.  CODE STATUS: Full code  TOTAL TIME TAKING CARE OF THIS PATIENT: 68 minutes.    Alford Highlandichard Zayan Delvecchio M.D on 01/28/2020 at 7:03 PM  Between 7am to 6pm - Pager - [phone]  After 6pm call admission pager [phone]  [facility]  CC: Primary care physician; Lynnea FerrierKlein, Bert J III, MD

## 2020-01-29 DIAGNOSIS — R55 Syncope and collapse: Secondary | ICD-10-CM | POA: Diagnosis not present

## 2020-01-29 DIAGNOSIS — N178 Other acute kidney failure: Secondary | ICD-10-CM

## 2020-01-29 DIAGNOSIS — R079 Chest pain, unspecified: Secondary | ICD-10-CM | POA: Diagnosis not present

## 2020-01-29 DIAGNOSIS — R0789 Other chest pain: Secondary | ICD-10-CM | POA: Diagnosis not present

## 2020-01-29 LAB — BASIC METABOLIC PANEL
Anion gap: 9 (ref 5–15)
BUN: 25 mg/dL — ABNORMAL HIGH (ref 8–23)
CO2: 28 mmol/L (ref 22–32)
Calcium: 8.6 mg/dL — ABNORMAL LOW (ref 8.9–10.3)
Chloride: 104 mmol/L (ref 98–111)
Creatinine, Ser: 1.28 mg/dL — ABNORMAL HIGH (ref 0.44–1.00)
GFR calc Af Amer: 46 mL/min — ABNORMAL LOW (ref >60)
GFR calc non Af Amer: 40 mL/min — ABNORMAL LOW (ref >60)
Glucose, Bld: 98 mg/dL (ref 70–99)
Potassium: 2.9 mmol/L — ABNORMAL LOW (ref 3.5–5.1)
Sodium: 141 mmol/L (ref 135–145)

## 2020-01-29 LAB — GLUCOSE, CAPILLARY
Glucose-Capillary: 67 mg/dL — ABNORMAL LOW (ref 70–99)
Glucose-Capillary: 69 mg/dL — ABNORMAL LOW (ref 70–99)
Glucose-Capillary: 79 mg/dL (ref 70–99)
Glucose-Capillary: 110 mg/dL — ABNORMAL HIGH (ref 70–99)

## 2020-01-29 LAB — CBC
HCT: 35.1 % — ABNORMAL LOW (ref 36.0–46.0)
Hemoglobin: 11.7 g/dL — ABNORMAL LOW (ref 12.0–15.0)
MCH: 33.3 pg (ref 26.0–34.0)
MCHC: 33.3 g/dL (ref 30.0–36.0)
MCV: 100 fL (ref 80.0–100.0)
Platelets: 181 10*3/uL (ref 150–400)
RBC: 3.51 MIL/uL — ABNORMAL LOW (ref 3.87–5.11)
RDW: 12.8 % (ref 11.5–15.5)
WBC: 5.3 10*3/uL (ref 4.0–10.5)
nRBC: 0 % (ref 0.0–0.2)

## 2020-01-29 LAB — LIPID PANEL
Cholesterol: 177 mg/dL (ref 0–200)
HDL: 40 mg/dL — ABNORMAL LOW (ref >40)
LDL Cholesterol: 104 mg/dL — ABNORMAL HIGH (ref 0–99)
Total CHOL/HDL Ratio: 4.4 RATIO
Triglycerides: 167 mg/dL — ABNORMAL HIGH (ref <150)
VLDL: 33 mg/dL (ref 0–40)

## 2020-01-29 LAB — HEMOGLOBIN A1C
Hgb A1c MFr Bld: 6.9 % — ABNORMAL HIGH (ref 4.8–5.6)
Mean Plasma Glucose: 151.33 mg/dL

## 2020-01-29 LAB — MAGNESIUM: Magnesium: 1.3 mg/dL — ABNORMAL LOW (ref 1.7–2.4)

## 2020-01-29 MED ORDER — POTASSIUM CHLORIDE CRYS ER 20 MEQ PO TBCR: 40.0000 meq | EXTENDED_RELEASE_TABLET | Freq: Two times a day (BID) | ORAL | Status: DC

## 2020-01-29 MED ORDER — MAGNESIUM CHLORIDE 64 MG PO TABS: 64.0000 mg | ORAL_TABLET | Freq: Two times a day (BID) | ORAL | 0 refills | Status: AC

## 2020-01-29 MED ORDER — MAGNESIUM SULFATE 4 GM/100ML IV SOLN
4.0000 g | Freq: Once | INTRAVENOUS | Status: AC
  Administered 2020-01-29: 4 g via INTRAVENOUS
  Filled 2020-01-29: qty 100

## 2020-01-29 MED ORDER — POTASSIUM CHLORIDE CRYS ER 20 MEQ PO TBCR
40.0000 meq | EXTENDED_RELEASE_TABLET | ORAL | Status: AC
  Administered 2020-01-29 (×2): 40 meq via ORAL
  Filled 2020-01-29 (×2): qty 2

## 2020-01-29 MED ORDER — POTASSIUM CHLORIDE ER 10 MEQ PO TBCR: 20.0000 meq | EXTENDED_RELEASE_TABLET | Freq: Two times a day (BID) | ORAL | 0 refills | Status: AC

## 2020-01-29 MED ORDER — METOPROLOL TARTRATE 25 MG PO TABS: 12.5000 mg | ORAL_TABLET | Freq: Two times a day (BID) | ORAL | 0 refills | Status: AC

## 2020-01-29 MED ORDER — LEVOTHYROXINE SODIUM 112 MCG PO TABS: 112.0000 ug | ORAL_TABLET | Freq: Every day | ORAL | Status: DC

## 2020-01-29 NOTE — Consult Note (Signed)
[facility]  CARDIOLOGY CONSULT NOTE  Patient ID: Suzanne Francis MRN: [id number] DOB/AGE: 1940/07/31 79 y.o.  Admit date: 01/28/2020 Referring Physician Chipper Herb Primary Physician [facility] Primary Cardiologist Fath Reason for Consultation near syncope  HPI: 79 year old female referred for evaluation of near syncope.  The patient reports that she was in her usual state of health until day of admission when she started to experience tingling all over with lightheadedness.  She reported a similar episode approximately a month ago.  She presented to [facility] emergency room where she was noted to be hemodynamically stable.  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  The patient reports her heart rate typically is 55 to 65 bpm.  Review of Dr. Odessa Fleming office notes shows pulse 60 to 65 bpm.  Admission labs were notable for borderline elevated troponin (90, 87, 75) without significant delta.  Emergency room, the patient did report brief chest discomfort.  On questioning today, patient denies significant chest pain.  Known coronary artery disease, status post drug-eluting stent ramus intermedius branch 03/25/2015.  The patient was on aspirin and Brilinta which was recently discontinued since she has been receiving eye injections for hemorrhages.  Review of systems complete and found to be negative unless listed above     Past Medical History:  Diagnosis Date  . Arthritis   . Bleeding of eye, bilateral   . CKD (chronic kidney disease)   . Coronary artery disease   . Diabetes mellitus without complication (HCC)   . GERD (gastroesophageal reflux disease)   . Hypertension   . OSA (obstructive sleep apnea)   . Renal insufficiency   . Thyroid disease    hypothyroidism  . Vitamin D deficiency     Past Surgical History:  Procedure Laterality Date  . CARDIAC CATHETERIZATION N/A 03/25/2015   Procedure: Left Heart Cath and Coronary  Angiography;  Surgeon: Dalia Heading, MD;  Location: [facility] INVASIVE CV LAB;  Service: Cardiovascular;  Laterality: N/A;  . CARDIAC CATHETERIZATION N/A 03/25/2015   Procedure: Coronary Stent Intervention;  Surgeon: Marykay Lex, MD;  Location: [facility] INVASIVE CV LAB;  Service: Cardiovascular;  Laterality: N/A;  . CARDIAC CATHETERIZATION N/A 12/11/2015   Procedure: Right/Left Heart Cath and Coronary Angiography;  Surgeon: Dalia Heading, MD;  Location: [facility] INVASIVE CV LAB;  Service: Cardiovascular;  Laterality: N/A;  . CHOLECYSTECTOMY    . CORONARY ANGIOPLASTY    . TONSILLECTOMY      Medications Prior to Admission  Medication Sig Dispense Refill Last Dose  . aspirin EC 81 MG EC tablet Take 1 tablet (81 mg total) by mouth daily. 30 tablet 11   . colchicine 0.6 MG tablet Take 0.6-1.2 mg by mouth as needed (for gout flares).     Marland Kitchen escitalopram (LEXAPRO) 10 MG tablet Take 10 mg by mouth daily.     Marland Kitchen glipiZIDE (GLUCOTROL XL) 10 MG 24 hr tablet Take 10 mg by mouth daily.     Marland Kitchen levothyroxine (SYNTHROID, LEVOTHROID) 125 MCG tablet Take 125 mcg by mouth daily before breakfast.     . lisinopril (PRINIVIL,ZESTRIL) 40 MG tablet Take 40 mg by mouth daily.     . metoprolol tartrate (LOPRESSOR) 25 MG tablet Take 1 tablet (25 mg total) by mouth 2 (two) times daily. 60 tablet 11   . nitroGLYCERIN (NITROSTAT) 0.4 MG SL tablet Place 1 tablet (0.4 mg total) under the tongue every 5 (five) minutes as needed for chest  pain. 20 tablet 12   . pioglitazone (ACTOS) 30 MG tablet Take 30 mg by mouth daily.     . rosuvastatin (CRESTOR) 20 MG tablet Take 1 tablet (20 mg total) by mouth daily at 6 PM. 30 tablet 11   . ticagrelor (BRILINTA) 90 MG TABS tablet Take 1 tablet (90 mg total) by mouth 2 (two) times daily. 60 tablet 11    Social History   Socioeconomic History  . Marital status: Widowed    Spouse name: Not on file  . Number of children: Not on file  . Years of education: Not on file  . Highest education level:  Not on file  Occupational History  . Not on file  Tobacco Use  . Smoking status: Former Smoker    Quit date: 03/24/1975    Years since quitting: 44.8  . Smokeless tobacco: Never Used  Substance and Sexual Activity  . Alcohol use: Yes    Alcohol/week: 0.0 standard drinks    Comment: glass of wine occasionally  . Drug use: No  . Sexual activity: Not on file  Other Topics Concern  . Not on file  Social History Narrative   Lives at home by herself; quit smoking in early 79s; drink wine; used to work bank.    Social Determinants of Health   Financial Resource Strain:   . Difficulty of Paying Living Expenses:   Food Insecurity:   . Worried About Programme researcher, broadcasting/film/video in the Last Year:   . Barista in the Last Year:   Transportation Needs:   . Freight forwarder (Medical):   Marland Kitchen Lack of Transportation (Non-Medical):   Physical Activity:   . Days of Exercise per Week:   . Minutes of Exercise per Session:   Stress:   . Feeling of Stress :   Social Connections:   . Frequency of Communication with Friends and Family:   . Frequency of Social Gatherings with Friends and Family:   . Attends Religious Services:   . Active Member of Clubs or Organizations:   . Attends Banker Meetings:   Marland Kitchen Marital Status:   Intimate Partner Violence:   . Fear of Current or Ex-Partner:   . Emotionally Abused:   Marland Kitchen Physically Abused:   . Sexually Abused:     Family History  Problem Relation Age of Onset  . CVA Father   . CAD Mother   . Alzheimer's disease Mother   . Breast cancer Maternal Aunt       Review of systems complete and found to be negative unless listed above      PHYSICAL EXAM  General: Well developed, well nourished, in no acute distress HEENT:  Normocephalic and atramatic Neck:  No JVD.  Lungs: Clear bilaterally to auscultation and percussion. Heart: HRRR . Normal S1 and S2 without gallops or murmurs.  Abdomen: Bowel sounds are positive, abdomen soft  and non-tender  Msk:  Back normal, normal gait. Normal strength and tone for age. Extremities: No clubbing, cyanosis or edema.   Neuro: Alert and oriented X 3. Psych:  Good affect, responds appropriately  Labs:   Lab Results  Component Value Date   WBC 5.3 01/29/2020   HGB 11.7 (L) 01/29/2020   HCT 35.1 (L) 01/29/2020   MCV 100.0 01/29/2020   PLT 181 01/29/2020    Recent Labs  Lab 01/29/20 0305  NA 141  K 2.9*  CL 104  CO2 28  BUN 25*  CREATININE 1.28*  CALCIUM 8.6*  GLUCOSE 98   Lab Results  Component Value Date   CKTOTAL 55 01/28/2020   CKMB 10.7 (H) 03/25/2015   TROPONINI 0.88 (H) 03/25/2015    Lab Results  Component Value Date   CHOL 177 01/29/2020   CHOL 188 03/24/2015   Lab Results  Component Value Date   HDL 40 (L) 01/29/2020   HDL 43 03/24/2015   Lab Results  Component Value Date   LDLCALC 104 (H) 01/29/2020   LDLCALC 104 (H) 03/24/2015   Lab Results  Component Value Date   TRIG 167 (H) 01/29/2020   TRIG 204 (H) 03/24/2015   Lab Results  Component Value Date   CHOLHDL 4.4 01/29/2020   CHOLHDL 4.4 03/24/2015   No results found for: LDLDIRECT    Radiology: DG Chest 2 View  Result Date: 01/28/2020 CLINICAL DATA:  Cough, shortness of breath EXAM: CHEST - 2 VIEW COMPARISON:  03/24/2015 FINDINGS: Heart size is within normal limits. Atherosclerotic calcification of the aortic knob. No focal airspace consolidation, pleural effusion, or pneumothorax. No acute osseous findings. IMPRESSION: No active cardiopulmonary disease. Electronically Signed   By: Duanne Guess D.O.   On: 01/28/2020 13:31   CT Angio Chest PE W and/or Wo Contrast  Result Date: 01/28/2020 CLINICAL DATA:  79 year old female with concern for pulmonary embolism. EXAM: CT ANGIOGRAPHY CHEST WITH CONTRAST TECHNIQUE: Multidetector CT imaging of the chest was performed using the standard protocol during bolus administration of intravenous contrast. Multiplanar CT image reconstructions and  MIPs were obtained to evaluate the vascular anatomy. CONTRAST:  38mL OMNIPAQUE IOHEXOL 350 MG/ML SOLN COMPARISON:  Chest CT dated 03/24/2015. Chest radiograph dated 01/28/2020. FINDINGS: Cardiovascular: There is mild to moderate cardiomegaly. No pericardial effusion. Mild atherosclerotic calcification of the thoracic aorta. No pulmonary artery embolus identified. Mediastinum/Nodes: There is no hilar or mediastinal adenopathy. There is a small hiatal hernia. The esophagus is grossly unremarkable. Thyroidectomy. No mediastinal fluid collection. Lungs/Pleura: Mild diffuse hazy streaky density throughout the lungs, likely atelectasis. No focal consolidation, pleural effusion, or pneumothorax. The central airways are patent. Upper Abdomen: Cholecystectomy. Musculoskeletal: Degenerative changes of the spine. No acute osseous pathology. Review of the MIP images confirms the above findings. IMPRESSION: 1. No acute intrathoracic pathology. No CT evidence of pulmonary artery embolus. 2. Mild to moderate cardiomegaly. 3. Aortic Atherosclerosis (ICD10-I70.0). Electronically Signed   By: Elgie Collard M.D.   On: 01/28/2020 17:23    EKG: Sinus bradycardia at 55 bpm  ASSESSMENT AND PLAN:   1.  Near syncope, with typical and atypical presentation, patient appears Niccoli and hemodynamically stable, mildly bradycardic on metoprolol 25 mg twice daily 2.  Borderline elevated troponin (90, 87, 75), without significant delta, in the absence of chest pain or new ECG changes 3.  CAD, status post DES ramus intermedius branch 03/25/2015, previously on aspirin and Brilinta which have been held 4.  Essential hypertension, blood pressure adequately controlled 5.  Chronic kidney disease  Recommendations  1.  Agree with overall current therapy 2.  Defer full dose anticoagulation 3.  Agree with decreasing metoprolol tartrate 4.5 mg twice daily 4.  Ambulate patient today, patient does well may discharge home 5.  Outpatient  14-day Holter monitor, to be placed today from my office 6.  Follow-up with Dr. Lady Gary in 1 week, possible outpatient functional study  Signed: Marcina Millard MD,PhD, [facility] 01/29/2020, 8:10 AM

## 2020-01-29 NOTE — Discharge Instructions (Signed)
1.  Follow-up with PCP in 1 week. 2.  Follow-up with you on cardiology in 1 to 2 weeks. 3.  Talk with your cardiologist and ophthalmologist to restart your dual antiplatelets treatment. 4.  Hold lisinopril and diuretics until seen by family doctor. 5.  Reduced dose of metoprolol.

## 2020-01-29 NOTE — Discharge Summary (Addendum)
Physician Discharge Summary  Patient ID: Suzanne Francis MRN: [id number] DOB/AGE: [birth year] 79 y.o.  Admit date: 01/28/2020 Discharge date: 01/29/2020  Admission Diagnoses:  Discharge Diagnoses:  Active Problems:   Chest pain   Discharged Condition: good  Hospital Course:  Patient is a 79 year old female with history of chronic kidney disease stage IIIb, coronary disease, type 2 diabetes, essential hypertension, obstructive sleep apnea, hypothyroidism who present to the hospital with complaints of chest pain and dizziness. Patient had recent eye injection with her ophthalmologist, her due antiplatelet treatment is still on hold.  She had significant diarrhea on Sunday, she had multiple loose stools.  On Monday and Tuesday, patient still has a poor appetite, was not able to drink or eat. By the time she came to the hospital, she was very dizzy, she also had acute kidney injury on chronic kidney disease stage IIIb.  She has mild elevation of troponin, she has been seen by cardiology, recommend continued follow-up with cardiology as outpatient.  Continue to hold her dual antiplatelet treatment until seen by ophthalmology. Patient received IV fluid overnight, she feels much better today.  Her potassium magnesium is low, will be supplemented before discharge.  #1.  Near syncope with dizziness. Secondary to dehydration from recent diarrhea and poor appetite.  Condition had improved after giving IV fluids.  2.  Acute kidney injury on chronic kidney disease stage IIIa.  POA. Patient received IV fluids, renal function has back to baseline.  3.  Essential hypertension. Due to acute kidney injury and dehydration, lisinopril will be on hold.  Diuretics will be on hold.  Beta-blocker was decreased in dose.  Patient need to follow-up with her family doctor to restart her blood pressure medicines.  4.  Coronary disease. Patient was on dual antiplatelet treatment; they are currently on hold due to  ophthalmology procedure.  Patient need to go back to ophthalmology ASAP to talk about restarting treatment.  5.  Type 2 diabetes. Resume all home medicines.  6.  Hypokalemia and hypomagnesemia. Supplemented through IV, will also give a few days of oral supplements as patient may have a deficit from her recent diarrhea.  7.  Chest pain with mild elevation of troponin. Chest pain is very short in duration, probably noncardiac.  Troponin elevation is secondary to demand ischemia.  Has been seen by cardiology.  Will be followed by cardiology as outpatient.   Consults:  Cardiology  Significant Diagnostic Studies:  CT ANGIOGRAPHY CHEST WITH CONTRAST  TECHNIQUE: Multidetector CT imaging of the chest was performed using the standard protocol during bolus administration of intravenous contrast. Multiplanar CT image reconstructions and MIPs were obtained to evaluate the vascular anatomy.  CONTRAST:  63mL OMNIPAQUE IOHEXOL 350 MG/ML SOLN  COMPARISON:  Chest CT dated 03/24/2015. Chest radiograph dated 01/28/2020.  FINDINGS: Cardiovascular: There is mild to moderate cardiomegaly. No pericardial effusion. Mild atherosclerotic calcification of the thoracic aorta. No pulmonary artery embolus identified.  Mediastinum/Nodes: There is no hilar or mediastinal adenopathy. There is a small hiatal hernia. The esophagus is grossly unremarkable. Thyroidectomy. No mediastinal fluid collection.  Lungs/Pleura: Mild diffuse hazy streaky density throughout the lungs, likely atelectasis. No focal consolidation, pleural effusion, or pneumothorax. The central airways are patent.  Upper Abdomen: Cholecystectomy.  Musculoskeletal: Degenerative changes of the spine. No acute osseous pathology.  Review of the MIP images confirms the above findings.  IMPRESSION: 1. No acute intrathoracic pathology. No CT evidence of pulmonary artery embolus. 2. Mild to moderate cardiomegaly. 3. Aortic  Atherosclerosis (ICD10-I70.0).  Treatments: IV fluid and cardiac monitor  Discharge Exam: Blood pressure (!) 142/50, pulse 55, temperature 98.1 F (36.7 C), resp. rate 18, height 4\' 11"  (1.499 m), weight 89 kg, SpO2 98 %. General appearance: alert and cooperative Resp: clear to auscultation bilaterally Cardio: regular rate and rhythm, S1, S2 normal, no murmur, click, rub or gallop GI: soft, non-tender; bowel sounds normal; no masses,  no organomegaly Extremities: extremities normal, atraumatic, no cyanosis or edema  Disposition: Discharge disposition: 01-Home or Self Care       Discharge Instructions    Diet - low sodium heart healthy   Complete by: As directed    Increase activity slowly   Complete by: As directed      Allergies as of 01/29/2020      Reactions   Ciprofloxacin Other (See Comments)   dysesthesia   Lovastatin Other (See Comments)   Muscle pain   Penicillins Hives, Other (See Comments)   Has patient had a PCN reaction causing immediate rash, facial/tongue/throat swelling, SOB or lightheadedness with hypotension: No Has patient had a PCN reaction causing severe rash involving mucus membranes or skin necrosis: No Has patient had a PCN reaction that required hospitalization No Has patient had a PCN reaction occurring within the last 10 years: Yes If all of the above answers are "NO", then may proceed with Cephalosporin use.   Simvastatin Other (See Comments)   Muscle pain      Medication List    STOP taking these medications   chlorthalidone 25 MG tablet Commonly known as: HYGROTON   lisinopril 40 MG tablet Commonly known as: ZESTRIL     TAKE these medications   allopurinol 300 MG tablet Commonly known as: ZYLOPRIM Take 300 mg by mouth daily.   colchicine 0.6 MG tablet Take 0.6-1.2 mg by mouth as needed (for gout flares).   escitalopram 10 MG tablet Commonly known as: LEXAPRO Take 10 mg by mouth daily.   famotidine 20 MG tablet Commonly  known as: PEPCID Take 20 mg by mouth 2 (two) times daily.   glipiZIDE 10 MG 24 hr tablet Commonly known as: GLUCOTROL XL Take 20 mg by mouth daily.   levothyroxine 112 MCG tablet Commonly known as: SYNTHROID Take 112 mcg by mouth every morning.   Magnesium Chloride 64 MG Tabs Take 64 mg by mouth in the morning and at bedtime for 7 days.   metoprolol tartrate 25 MG tablet Commonly known as: LOPRESSOR Take 0.5 tablets (12.5 mg total) by mouth 2 (two) times daily. What changed: how much to take   nitroGLYCERIN 0.4 MG SL tablet Commonly known as: NITROSTAT Place 1 tablet (0.4 mg total) under the tongue every 5 (five) minutes as needed for chest pain.   pioglitazone 30 MG tablet Commonly known as: ACTOS Take 30 mg by mouth daily.   potassium chloride 10 MEQ tablet Commonly known as: KLOR-CON Take 2 tablets (20 mEq total) by mouth 2 (two) times daily for 3 days.       Follow-up Information    01/31/2020 III, MD Follow up in 1 week(s).   Specialty: Internal Medicine Contact information: [address] [facility] [place] [phone]        [phone], MD Follow up in 2 week(s).   Specialty: Cardiology Contact information: [address] [facility] [place] [phone]               Signed: [phone] 01/29/2020, 9:59 AM

## 2020-01-29 NOTE — Progress Notes (Signed)
MD notified. Pt's HR is 58-60. RN held metoprolol 12.5mg . MD acknowledged. I will continue to assess.

## 2020-02-13 ENCOUNTER — Other Ambulatory Visit: Payer: Self-pay

## 2020-02-13 ENCOUNTER — Inpatient Hospital Stay: Payer: Medicare Other | Attending: Internal Medicine

## 2020-02-13 DIAGNOSIS — H348132 Central retinal vein occlusion, bilateral, stable: Secondary | ICD-10-CM | POA: Insufficient documentation

## 2020-02-13 DIAGNOSIS — Z87891 Personal history of nicotine dependence: Secondary | ICD-10-CM | POA: Diagnosis not present

## 2020-02-13 LAB — CBC WITH DIFFERENTIAL/PLATELET
Abs Immature Granulocytes: 0.01 10*3/uL (ref 0.00–0.07)
Basophils Absolute: 0.1 10*3/uL (ref 0.0–0.1)
Basophils Relative: 1 %
Eosinophils Absolute: 0.2 10*3/uL (ref 0.0–0.5)
Eosinophils Relative: 4 %
HCT: 37.9 % (ref 36.0–46.0)
Hemoglobin: 12.5 g/dL (ref 12.0–15.0)
Immature Granulocytes: 0 %
Lymphocytes Relative: 39 %
Lymphs Abs: 1.8 10*3/uL (ref 0.7–4.0)
MCH: 32.6 pg (ref 26.0–34.0)
MCHC: 33 g/dL (ref 30.0–36.0)
MCV: 99 fL (ref 80.0–100.0)
Monocytes Absolute: 0.4 10*3/uL (ref 0.1–1.0)
Monocytes Relative: 8 %
Neutro Abs: 2.1 10*3/uL (ref 1.7–7.7)
Neutrophils Relative %: 48 %
Platelets: 197 10*3/uL (ref 150–400)
RBC: 3.83 MIL/uL — ABNORMAL LOW (ref 3.87–5.11)
RDW: 13.3 % (ref 11.5–15.5)
WBC: 4.5 10*3/uL (ref 4.0–10.5)
nRBC: 0 % (ref 0.0–0.2)

## 2020-02-13 LAB — PROTIME-INR
INR: 0.9 (ref 0.8–1.2)
Prothrombin Time: 12.2 seconds (ref 11.4–15.2)

## 2020-02-13 LAB — COMPREHENSIVE METABOLIC PANEL
ALT: 12 U/L (ref 0–44)
AST: 15 U/L (ref 15–41)
Albumin: 3.9 g/dL (ref 3.5–5.0)
Alkaline Phosphatase: 54 U/L (ref 38–126)
Anion gap: 8 (ref 5–15)
BUN: 25 mg/dL — ABNORMAL HIGH (ref 8–23)
CO2: 29 mmol/L (ref 22–32)
Calcium: 8.5 mg/dL — ABNORMAL LOW (ref 8.9–10.3)
Chloride: 106 mmol/L (ref 98–111)
Creatinine, Ser: 1.34 mg/dL — ABNORMAL HIGH (ref 0.44–1.00)
GFR calc Af Amer: 44 mL/min — ABNORMAL LOW (ref >60)
GFR calc non Af Amer: 38 mL/min — ABNORMAL LOW (ref >60)
Glucose, Bld: 118 mg/dL — ABNORMAL HIGH (ref 70–99)
Potassium: 3.9 mmol/L (ref 3.5–5.1)
Sodium: 143 mmol/L (ref 135–145)
Total Bilirubin: 0.6 mg/dL (ref 0.3–1.2)
Total Protein: 6.7 g/dL (ref 6.5–8.1)

## 2020-02-13 LAB — APTT: aPTT: 32 seconds (ref 24–36)

## 2020-02-14 LAB — ANTIPHOSPHOLIPID SYNDROME PROF
Anticardiolipin IgG: <9 GPL U/mL (ref 0–14)
Anticardiolipin IgM: 15 MPL U/mL — ABNORMAL HIGH (ref 0–12)
DRVVT: 26.8 s (ref 0.0–47.0)
PTT Lupus Anticoagulant: 32.8 s (ref 0.0–51.9)

## 2020-02-27 ENCOUNTER — Inpatient Hospital Stay (HOSPITAL_BASED_OUTPATIENT_CLINIC_OR_DEPARTMENT_OTHER): Payer: Medicare Other | Admitting: Internal Medicine

## 2020-02-27 ENCOUNTER — Other Ambulatory Visit: Payer: Self-pay

## 2020-02-27 DIAGNOSIS — H348132 Central retinal vein occlusion, bilateral, stable: Secondary | ICD-10-CM | POA: Diagnosis not present

## 2020-02-27 NOTE — Assessment & Plan Note (Addendum)
#  Bilateral central vein occlusions-patient's antiphospholipid antibody syndrome work-up is essentially negative.  Repeat-August 2021 patient's IgM anticardiolipin-slightly positive at 15 [normal less than 12]-indeterminate.  Otherwise other antibodies/DRV VT negative.  #  I do not clinically think patient has antiphospholipid antibody syndrome-and I would not recommend any anticoagulation from the perspective of APS.  Defer to ophthalmology for further follow-up/recommendations.   I left a message for Dr. Lelan Pons to discuss above recommendations.  # DISPOSITION:  # Follow up as needed-Dr.B.   Cc; Dr.Klein/Dr.Govind.   Addendum: on 8/31-I spoke to Dr.Govind-regarding my clinical impression that patient does not appear to have antiphospholipid antibody syndrome.  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

## 2020-02-27 NOTE — Progress Notes (Signed)
[facility] CONSULT NOTE  Patient Care Team: Lynnea Ferrier, MD as PCP - General (Internal Medicine)  CHIEF COMPLAINTS/PURPOSE OF CONSULTATION: bil rentinal vein occlusion; positive anti-cardiolipin antibody  # Bilateral central Vein occlusion- [[facility]; Dr.Kishan Govind- [phone]];  may 17th [opthalmologist] APTT 27.5; PT 10.5; DRV VT 34.1; anxiety cardiolipin antibody IgG less than 9; beta-2 glycoprotein IgG IgM less than 9; anticardiolipin IgM 15 [normal less than 12]-slightly abnormal/indeterminate.  Protein S-total; free; functional-normal; protein C-antigen/-function normal; homocystine normal; Antithrombin III-activity/antigen-normal; hemoglobin 13 white count 4.7 platelets 228; AUG 1st 2021- IgM- 15 [indetreminate]; lupus anticoagulant-negative.   # CAD; CKD; DM-2  Oncology History   No history exists.     HISTORY OF PRESENTING ILLNESS:  Suzanne Francis 79 y.o.  female history of central retinal vein occlusion for abnormal antiphospholipid antibody profile is here for follow-up.  Patient denies any worsening vision problems.  She continues to follow-up with ophthalmology.  No DVT PE or any thromboembolic event.   Review of Systems  Constitutional: Negative for chills, diaphoresis, fever, malaise/fatigue and weight loss.  HENT: Negative for nosebleeds and sore throat.   Eyes: Negative for double vision.  Respiratory: Negative for cough, hemoptysis, sputum production, shortness of breath and wheezing.   Cardiovascular: Negative for chest pain, palpitations, orthopnea and leg swelling.  Gastrointestinal: Negative for abdominal pain, blood in stool, constipation, diarrhea, heartburn, melena, nausea and vomiting.  Genitourinary: Negative for dysuria, frequency and urgency.  Musculoskeletal: Negative for back pain and joint pain.  Skin: Negative.  Negative for itching and rash.  Neurological: Negative for dizziness, tingling, focal weakness, weakness  and headaches.  Endo/Heme/Allergies: Does not bruise/bleed easily.  Psychiatric/Behavioral: Negative for depression. The patient is not nervous/anxious and does not have insomnia.      MEDICAL HISTORY:  Past Medical History:  Diagnosis Date  . Arthritis   . Bleeding of eye, bilateral   . CKD (chronic kidney disease)   . Coronary artery disease   . Diabetes mellitus without complication (HCC)   . GERD (gastroesophageal reflux disease)   . Hypertension   . OSA (obstructive sleep apnea)   . Renal insufficiency   . Thyroid disease    hypothyroidism  . Vitamin D deficiency     SURGICAL HISTORY: Past Surgical History:  Procedure Laterality Date  . CARDIAC CATHETERIZATION N/A 03/25/2015   Procedure: Left Heart Cath and Coronary Angiography;  Surgeon: Dalia Heading, MD;  Location: [facility] INVASIVE CV LAB;  Service: Cardiovascular;  Laterality: N/A;  . CARDIAC CATHETERIZATION N/A 03/25/2015   Procedure: Coronary Stent Intervention;  Surgeon: Marykay Lex, MD;  Location: [facility] INVASIVE CV LAB;  Service: Cardiovascular;  Laterality: N/A;  . CARDIAC CATHETERIZATION N/A 12/11/2015   Procedure: Right/Left Heart Cath and Coronary Angiography;  Surgeon: Dalia Heading, MD;  Location: [facility] INVASIVE CV LAB;  Service: Cardiovascular;  Laterality: N/A;  . CHOLECYSTECTOMY    . CORONARY ANGIOPLASTY    . TONSILLECTOMY      SOCIAL HISTORY: Social History   Socioeconomic History  . Marital status: Widowed    Spouse name: Not on file  . Number of children: Not on file  . Years of education: Not on file  . Highest education level: Not on file  Occupational History  . Not on file  Tobacco Use  . Smoking status: Former Smoker    Quit date: 03/24/1975    Years since quitting: 44.9  . Smokeless tobacco: Never Used  Substance and Sexual Activity  .  Alcohol use: Yes    Alcohol/week: 0.0 standard drinks    Comment: glass of wine occasionally  . Drug use: No  . Sexual activity: Not on file  Other  Topics Concern  . Not on file  Social History Narrative   Lives at home by herself; quit smoking in early 47s; drink wine; used to work bank.    Social Determinants of Health   Financial Resource Strain:   . Difficulty of Paying Living Expenses: Not on file  Food Insecurity:   . Worried About Programme researcher, broadcasting/film/video in the Last Year: Not on file  . Ran Out of Food in the Last Year: Not on file  Transportation Needs:   . Lack of Transportation (Medical): Not on file  . Lack of Transportation (Non-Medical): Not on file  Physical Activity:   . Days of Exercise per Week: Not on file  . Minutes of Exercise per Session: Not on file  Stress:   . Feeling of Stress : Not on file  Social Connections:   . Frequency of Communication with Friends and Family: Not on file  . Frequency of Social Gatherings with Friends and Family: Not on file  . Attends Religious Services: Not on file  . Active Member of Clubs or Organizations: Not on file  . Attends Banker Meetings: Not on file  . Marital Status: Not on file  Intimate Partner Violence:   . Fear of Current or Ex-Partner: Not on file  . Emotionally Abused: Not on file  . Physically Abused: Not on file  . Sexually Abused: Not on file    FAMILY HISTORY: Family History  Problem Relation Age of Onset  . CVA Father   . CAD Mother   . Alzheimer's disease Mother   . Breast cancer Maternal Aunt     ALLERGIES:  is allergic to ciprofloxacin, lovastatin, penicillins, and simvastatin.  MEDICATIONS:  Current Outpatient Medications  Medication Sig Dispense Refill  . allopurinol (ZYLOPRIM) 300 MG tablet Take 300 mg by mouth daily.    Marland Kitchen escitalopram (LEXAPRO) 10 MG tablet Take 10 mg by mouth daily.    . famotidine (PEPCID) 20 MG tablet Take 20 mg by mouth 2 (two) times daily.    Marland Kitchen glipiZIDE (GLUCOTROL XL) 10 MG 24 hr tablet Take 20 mg by mouth daily.     Marland Kitchen levothyroxine (SYNTHROID) 112 MCG tablet Take 112 mcg by mouth every morning.     . metoprolol tartrate (LOPRESSOR) 25 MG tablet Take 0.5 tablets (12.5 mg total) by mouth 2 (two) times daily. (Patient taking differently: Take 25 mg by mouth 2 (two) times daily. ) 10 tablet 0  . pioglitazone (ACTOS) 30 MG tablet Take 30 mg by mouth daily.    . potassium chloride (KLOR-CON) 10 MEQ tablet Take 2 tablets (20 mEq total) by mouth 2 (two) times daily for 3 days. 12 tablet 0  . colchicine 0.6 MG tablet Take 0.6-1.2 mg by mouth as needed (for gout flares). (Patient not taking: Reported on 02/27/2020)    . cyanocobalamin (,VITAMIN B-12,) 1000 MCG/ML injection Inject 1,000 mcg into the muscle every 30 (thirty) days. (Patient not taking: Reported on 02/27/2020)    . nitroGLYCERIN (NITROSTAT) 0.4 MG SL tablet Place 1 tablet (0.4 mg total) under the tongue every 5 (five) minutes as needed for chest pain. (Patient not taking: Reported on 02/27/2020) 20 tablet 12   No current facility-administered medications for this visit.      Marland Kitchen  PHYSICAL EXAMINATION:  Vitals:   02/27/20 1015  BP: (!) 179/76  Pulse: (!) 53  Resp: 20  Temp: 97.9 F (36.6 C)   Filed Weights   02/27/20 1029  Weight: 198 lb (89.8 kg)    Physical Exam HENT:     Head: Normocephalic and atraumatic.     Mouth/Throat:     Pharynx: No oropharyngeal exudate.  Eyes:     Pupils: Pupils are equal, round, and reactive to light.  Cardiovascular:     Rate and Rhythm: Normal rate and regular rhythm.  Pulmonary:     Effort: Pulmonary effort is normal. No respiratory distress.     Breath sounds: Normal breath sounds. No wheezing.  Abdominal:     General: Bowel sounds are normal. There is no distension.     Palpations: Abdomen is soft. There is no mass.     Tenderness: There is no abdominal tenderness. There is no guarding or rebound.  Musculoskeletal:        General: No tenderness. Normal range of motion.     Cervical back: Normal range of motion and neck supple.  Skin:    General: Skin is warm.  Neurological:      Mental Status: She is alert and oriented to person, place, and time.  Psychiatric:        Mood and Affect: Affect normal.      LABORATORY DATA:  I have reviewed the data as listed Lab Results  Component Value Date   WBC 4.5 02/13/2020   HGB 12.5 02/13/2020   HCT 37.9 02/13/2020   MCV 99.0 02/13/2020   PLT 197 02/13/2020   Recent Labs    01/28/20 1311 01/29/20 0305 02/13/20 1047  NA 138 141 143  K 3.5 2.9* 3.9  CL 101 104 106  CO2 30 28 29   GLUCOSE 220* 98 118*  BUN 22 25* 25*  CREATININE 1.46* 1.28* 1.34*  CALCIUM 9.2 8.6* 8.5*  GFRNONAA 34* 40* 38*  GFRAA 40* 46* 44*  PROT  --   --  6.7  ALBUMIN  --   --  3.9  AST  --   --  15  ALT  --   --  12  ALKPHOS  --   --  54  BILITOT  --   --  0.6    RADIOGRAPHIC STUDIES: I have personally reviewed the radiological images as listed and agreed with the findings in the report. No results found.  ASSESSMENT & PLAN:   Central retinal vein occlusion, bilateral, stable #Bilateral central vein occlusions-patient's antiphospholipid antibody syndrome work-up is essentially negative.  Repeat-August 2021 patient's IgM anticardiolipin-slightly positive at 15 [normal less than 12]-indeterminate.  Otherwise other antibodies/DRV VT negative.  #  I do not clinically think patient has antiphospholipid antibody syndrome-and I would not recommend any anticoagulation from the perspective of APS.  Defer to ophthalmology for further follow-up/recommendations.   I left a message for Dr. September 2021 to discuss above recommendations.  # DISPOSITION:  # Follow up as needed-Dr.B.   Cc; Dr.Klein/Dr.Govind.      All questions were answered. The patient knows to call the clinic with any problems, questions or concerns.    Lelan Pons, MD 02/28/2020 4:21 PM

## 2020-03-04 ENCOUNTER — Telehealth: Payer: Self-pay | Admitting: Internal Medicine

## 2020-03-04 NOTE — Telephone Encounter (Signed)
Discussed with Dr.Govind-ophthalmology-IgM anticardiolipin antibody slightly elevated indeterminate on repeat.  Clinically no evidence of antiphospholipid antibody syndrome.  No hematologic concerns for hypercoagulable state.  Defer to ophthalmology for management central retinal vein thrombosis.  Patient follow-up as needed.

## 2020-07-22 ENCOUNTER — Other Ambulatory Visit: Payer: Self-pay

## 2020-07-22 ENCOUNTER — Emergency Department
Admission: EM | Admit: 2020-07-22 | Discharge: 2020-07-22 | Disposition: A | Discharge status: Home / Self Care | Payer: Medicare Other | Attending: Emergency Medicine | Admitting: Emergency Medicine

## 2020-07-22 ENCOUNTER — Emergency Department: Payer: Medicare Other

## 2020-07-22 DIAGNOSIS — N189 Chronic kidney disease, unspecified: Secondary | ICD-10-CM | POA: Insufficient documentation

## 2020-07-22 DIAGNOSIS — I129 Hypertensive chronic kidney disease with stage 1 through stage 4 chronic kidney disease, or unspecified chronic kidney disease: Secondary | ICD-10-CM | POA: Diagnosis not present

## 2020-07-22 DIAGNOSIS — J1282 Pneumonia due to coronavirus disease 2019: Secondary | ICD-10-CM | POA: Insufficient documentation

## 2020-07-22 DIAGNOSIS — U071 COVID-19: Secondary | ICD-10-CM | POA: Diagnosis not present

## 2020-07-22 DIAGNOSIS — E1122 Type 2 diabetes mellitus with diabetic chronic kidney disease: Secondary | ICD-10-CM | POA: Diagnosis not present

## 2020-07-22 DIAGNOSIS — I25110 Atherosclerotic heart disease of native coronary artery with unstable angina pectoris: Secondary | ICD-10-CM | POA: Diagnosis not present

## 2020-07-22 DIAGNOSIS — Z87891 Personal history of nicotine dependence: Secondary | ICD-10-CM | POA: Diagnosis not present

## 2020-07-22 DIAGNOSIS — Z7984 Long term (current) use of oral hypoglycemic drugs: Secondary | ICD-10-CM | POA: Insufficient documentation

## 2020-07-22 DIAGNOSIS — E039 Hypothyroidism, unspecified: Secondary | ICD-10-CM | POA: Diagnosis not present

## 2020-07-22 DIAGNOSIS — Z79899 Other long term (current) drug therapy: Secondary | ICD-10-CM | POA: Insufficient documentation

## 2020-07-22 DIAGNOSIS — R0602 Shortness of breath: Secondary | ICD-10-CM

## 2020-07-22 DIAGNOSIS — R531 Weakness: Secondary | ICD-10-CM

## 2020-07-22 LAB — CBC
HCT: 42 % (ref 36.0–46.0)
Hemoglobin: 13.7 g/dL (ref 12.0–15.0)
MCH: 32.2 pg (ref 26.0–34.0)
MCHC: 32.6 g/dL (ref 30.0–36.0)
MCV: 98.8 fL (ref 80.0–100.0)
Platelets: 176 10*3/uL (ref 150–400)
RBC: 4.25 MIL/uL (ref 3.87–5.11)
RDW: 12.9 % (ref 11.5–15.5)
WBC: 4.1 10*3/uL (ref 4.0–10.5)
nRBC: 0 % (ref 0.0–0.2)

## 2020-07-22 LAB — BASIC METABOLIC PANEL
Anion gap: 14 (ref 5–15)
BUN: 16 mg/dL (ref 8–23)
CO2: 28 mmol/L (ref 22–32)
Calcium: 8 mg/dL — ABNORMAL LOW (ref 8.9–10.3)
Chloride: 98 mmol/L (ref 98–111)
Creatinine, Ser: 1.21 mg/dL — ABNORMAL HIGH (ref 0.44–1.00)
GFR, Estimated: 46 mL/min — ABNORMAL LOW (ref >60)
Glucose, Bld: 115 mg/dL — ABNORMAL HIGH (ref 70–99)
Potassium: 3.2 mmol/L — ABNORMAL LOW (ref 3.5–5.1)
Sodium: 140 mmol/L (ref 135–145)

## 2020-07-22 LAB — TROPONIN I (HIGH SENSITIVITY): Troponin I (High Sensitivity): 9 ng/L (ref <18)

## 2020-07-22 LAB — CBG MONITORING, ED: Glucose-Capillary: 102 mg/dL — ABNORMAL HIGH (ref 70–99)

## 2020-07-22 MED ORDER — ALBUTEROL SULFATE HFA 108 (90 BASE) MCG/ACT IN AERS: 2.0000 | INHALATION_SPRAY | Freq: Four times a day (QID) | RESPIRATORY_TRACT | 0 refills | Status: AC | PRN

## 2020-07-22 MED ORDER — POTASSIUM CHLORIDE CRYS ER 20 MEQ PO TBCR
40.0000 meq | EXTENDED_RELEASE_TABLET | Freq: Once | ORAL | Status: AC
  Administered 2020-07-22: 40 meq via ORAL
  Filled 2020-07-22: qty 2

## 2020-07-22 MED ORDER — ONDANSETRON 4 MG PO TBDP: 4.0000 mg | ORAL_TABLET | Freq: Three times a day (TID) | ORAL | 0 refills | Status: AC | PRN

## 2020-07-22 NOTE — ED Provider Notes (Signed)
[facility] Emergency Department Provider Note   ____________________________________________   Event Date/Time   First MD Initiated Contact with Patient 07/22/20 1245     (approximate)  I have reviewed the triage vital signs and the nursing notes.   HISTORY  Chief Complaint Weakness    HPI Suzanne Francis is a 80 y.o. female with past medical history of hypertension, diabetes, CAD, and CKD who presents to the ED complaining of weakness and shortness of breath.  Patient reports that she for started feeling bad 7 days ago, subsequently took an at home test for COVID-19 that came back positive.  She denies any recent fevers, but has been feeling increasingly weak with some difficulty breathing and congestion in her chest.  She denies any pain in her chest.  She has had some nausea with vomiting and diarrhea, but denies any abdominal pain or dysuria.  She is fully vaccinated against COVID-19, denies significant difficulty breathing at rest.        Past Medical History:  Diagnosis Date  . Arthritis   . Bleeding of eye, bilateral   . CKD (chronic kidney disease)   . Coronary artery disease   . Diabetes mellitus without complication (HCC)   . GERD (gastroesophageal reflux disease)   . Hypertension   . OSA (obstructive sleep apnea)   . Renal insufficiency   . Thyroid disease    hypothyroidism  . Vitamin D deficiency     Patient Active Problem List   Diagnosis Date Noted  . Chest pain 01/28/2020  . Near syncope   . Essential hypertension   . Acute kidney injury superimposed on CKD (HCC)   . Type 2 diabetes mellitus with hyperlipidemia (HCC)   . Anxiety and depression   . Hypothyroidism   . 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 Coronary artery disease involving native coronary artery of native heart with unstable angina pectoris (HCC)   . NSTEMI (non-ST elevated myocardial infarction) (HCC) 03/24/2015    Past Surgical  History:  Procedure Laterality Date  . CARDIAC CATHETERIZATION N/A 03/25/2015   Procedure: Left Heart Cath and Coronary Angiography;  Surgeon: Dalia Heading, MD;  Location: [facility] INVASIVE CV LAB;  Service: Cardiovascular;  Laterality: N/A;  . CARDIAC CATHETERIZATION N/A 03/25/2015   Procedure: Coronary Stent Intervention;  Surgeon: Marykay Lex, MD;  Location: [facility] INVASIVE CV LAB;  Service: Cardiovascular;  Laterality: N/A;  . CARDIAC CATHETERIZATION N/A 12/11/2015   Procedure: Right/Left Heart Cath and Coronary Angiography;  Surgeon: Dalia Heading, MD;  Location: [facility] INVASIVE CV LAB;  Service: Cardiovascular;  Laterality: N/A;  . CHOLECYSTECTOMY    . CORONARY ANGIOPLASTY    . TONSILLECTOMY      Prior to Admission medications   Medication Sig Start Date End Date Taking? Authorizing Provider  albuterol (VENTOLIN HFA) 108 (90 Base) MCG/ACT inhaler Inhale 2 puffs into the lungs every 6 (six) hours as needed for wheezing or shortness of breath. 07/22/20  Yes Chesley Noon, MD  ondansetron (ZOFRAN ODT) 4 MG disintegrating tablet Take 1 tablet (4 mg total) by mouth every 8 (eight) hours as needed for nausea or vomiting. 07/22/20  Yes Chesley Noon, MD  allopurinol (ZYLOPRIM) 300 MG tablet Take 300 mg by mouth daily. 12/03/19   [provider]  colchicine 0.6 MG tablet Take 0.6-1.2 mg by mouth as needed (for gout flares). Patient not taking: Reported on 02/27/2020    [provider]  cyanocobalamin (,VITAMIN B-12,) 1000 MCG/ML injection  Inject 1,000 mcg into the muscle every 30 (thirty) days. Patient not taking: Reported on 02/27/2020 09/12/19   [provider]  escitalopram (LEXAPRO) 10 MG tablet Take 10 mg by mouth daily.    [provider]  famotidine (PEPCID) 20 MG tablet Take 20 mg by mouth 2 (two) times daily. 01/03/20   [provider]  glipiZIDE (GLUCOTROL XL) 10 MG 24 hr tablet Take 20 mg by mouth daily.     [provider]  levothyroxine  (SYNTHROID) 112 MCG tablet Take 112 mcg by mouth every morning. 01/22/20   [provider]  metoprolol tartrate (LOPRESSOR) 25 MG tablet Take 0.5 tablets (12.5 mg total) by mouth 2 (two) times daily. Patient taking differently: Take 25 mg by mouth 2 (two) times daily.  01/29/20   Marrion Coy, MD  nitroGLYCERIN (NITROSTAT) 0.4 MG SL tablet Place 1 tablet (0.4 mg total) under the tongue every 5 (five) minutes as needed for chest pain. Patient not taking: Reported on 02/27/2020 03/27/15   Curtis Sites III, MD  pioglitazone (ACTOS) 30 MG tablet Take 30 mg by mouth daily.    [provider]  potassium chloride (KLOR-CON) 10 MEQ tablet Take 2 tablets (20 mEq total) by mouth 2 (two) times daily for 3 days. 01/29/20 02/27/20  Marrion Coy, MD    Allergies Ciprofloxacin, Lovastatin, Penicillins, and Simvastatin  Family History  Problem Relation Age of Onset  . CVA Father   . CAD Mother   . Alzheimer's disease Mother   . Breast cancer Maternal Aunt     Social History Social History   Tobacco Use  . Smoking status: Former Smoker    Quit date: 03/24/1975    Years since quitting: 45.3  . Smokeless tobacco: Never Used  Substance Use Topics  . Alcohol use: Yes    Alcohol/week: 0.0 standard drinks    Comment: glass of wine occasionally  . Drug use: No    Review of Systems  Constitutional: No fever/chills.  Positive for generalized weakness. Eyes: No visual changes. ENT: No sore throat. Cardiovascular: Denies chest pain. Respiratory: Positive for cough and shortness of breath. Gastrointestinal: No abdominal pain.  Positive for nausea, vomiting, and diarrhea.  No constipation. Genitourinary: Negative for dysuria. Musculoskeletal: Negative for back pain. Skin: Negative for rash. Neurological: Negative for headaches, focal weakness or numbness.  ____________________________________________   PHYSICAL EXAM:  VITAL SIGNS: ED Triage Vitals  Enc Vitals Group     BP  07/22/20 1126 (!) 120/55     Pulse Rate 07/22/20 1126 69     Resp 07/22/20 1126 18     Temp 07/22/20 1126 98.5 F (36.9 C)     Temp Source 07/22/20 1126 Oral     SpO2 07/22/20 1126 96 %     Weight 07/22/20 1127 190 lb (86.2 kg)     Height 07/22/20 1127 4\' 10"  (1.473 m)     Head Circumference --      Peak Flow --      Pain Score 07/22/20 1127 0     Pain Loc --      Pain Edu? --      Excl. in GC? --     Constitutional: Alert and oriented. Eyes: Conjunctivae are normal. Head: Atraumatic. Nose: No congestion/rhinnorhea. Mouth/Throat: Mucous membranes are moist. Neck: Normal ROM Cardiovascular: Normal rate, regular rhythm. Grossly normal heart sounds.  2+ radial pulses bilaterally. Respiratory: Normal respiratory effort.  No retractions. Lungs CTAB. Gastrointestinal: Soft and nontender. No distention. Genitourinary:  deferred Musculoskeletal: No lower extremity tenderness nor edema. Neurologic:  Normal speech and language. No gross focal neurologic deficits are appreciated. Skin:  Skin is warm, dry and intact. No rash noted. Psychiatric: Mood and affect are normal. Speech and behavior are normal.  ____________________________________________   LABS (all labs ordered are listed, but only abnormal results are displayed)  Labs Reviewed  BASIC METABOLIC PANEL - Abnormal; Notable for the following components:      Result Value   Potassium 3.2 (*)    Glucose, Bld 115 (*)    Creatinine, Ser 1.21 (*)    Calcium 8.0 (*)    GFR, Estimated 46 (*)    All other components within normal limits  CBG MONITORING, ED - Abnormal; Notable for the following components:   Glucose-Capillary 102 (*)    All other components within normal limits  SARS CORONAVIRUS 2 (TAT 6-24 HRS)  CBC  URINALYSIS, COMPLETE (UACMP) WITH MICROSCOPIC  TROPONIN I (HIGH SENSITIVITY)   ____________________________________________  EKG  ED ECG REPORT I, Chesley Noon, the attending physician, personally viewed  and interpreted this ECG.   Date: 07/22/2020  EKG Time: 11:33  Rate: 67  Rhythm: normal sinus rhythm  Axis: LAD  Intervals:none  ST&T Change: None   PROCEDURES  Procedure(s) performed (including Critical Care):  Procedures   ____________________________________________   INITIAL IMPRESSION / ASSESSMENT AND PLAN / ED COURSE       80 year old female with medical history of hypertension, diabetes, CAD, and CKD who presents to the ED complaining of increasing weakness and difficulty breathing after testing positive for COVID-19 last week.  Patient is maintaining O2 sats on room air at rest, not in any respiratory distress.  EKG shows no evidence of arrhythmia or ischemia, we will add on troponin.  Labs otherwise remarkable only for mild hypokalemia, which we will replete.  Chest x-ray and UA are pending, but I have a low suspicion for UTI given patient's lack of urinary symptoms.  Chest x-ray reviewed by me and shows subtle opacities at bilateral bases concerning for pneumonia associated with COVID-19.  Patient continues to have minimal shortness of breath at rest, able to ambulate without difficulty and maintaining O2 sats of 90 to 94% on room air.  Troponin is within normal limits.  Patient is appropriate for discharge home with PCP follow-up, we will prescribe Zofran and albuterol for symptomatic management.  She was counseled to return to the ED for new or worsening symptoms.  Patient agrees with plan.      ____________________________________________   FINAL CLINICAL IMPRESSION(S) / ED DIAGNOSES  Final diagnoses:  Pneumonia due to COVID-19 virus  Generalized weakness  Shortness of breath     ED Discharge Orders         Ordered    albuterol (VENTOLIN HFA) 108 (90 Base) MCG/ACT inhaler  Every 6 hours PRN       Note to Pharmacy: Please supply with spacer   07/22/20 1410    ondansetron (ZOFRAN ODT) 4 MG disintegrating tablet  Every 8 hours PRN        07/22/20 1410            Note:  This document was prepared using Dragon voice recognition software and may include unintentional dictation errors.   Chesley Noon, MD 07/22/20 [phone]

## 2020-07-22 NOTE — ED Notes (Signed)
ED Provider at bedside. 

## 2020-07-22 NOTE — ED Notes (Signed)
Pt ambulatory in room with pulse oximetry in place. Pt maintained o2 sats of 90-94% on room air, no NAD, no SHOB. Md Waldo aware.

## 2020-07-22 NOTE — ED Triage Notes (Signed)
Pt here with weakness. Pt tested positive for covid last week but has been feeling bad since last Wed. Pt is having SOB today. Vitals WNL in triage.

## 2020-07-23 LAB — SARS CORONAVIRUS 2 (TAT 6-24 HRS): SARS Coronavirus 2: POSITIVE — AB

## 2021-03-02 IMAGING — CT CT ANGIO CHEST
2 of 6 series · 18 of 46 positions shown · IV contrast (APPLIED)
Comparison: Chest CT dated 03/24/2015. Chest radiograph dated
01/28/2020.

CLINICAL DATA: 78-year-old female with concern for pulmonary
embolism.

EXAM:
CT ANGIOGRAPHY CHEST WITH CONTRAST
TECHNIQUE: Multidetector CT imaging of the chest was performed using the
standard protocol during bolus administration of intravenous
contrast. Multiplanar CT image reconstructions and MIPs were
obtained to evaluate the vascular anatomy.
CONTRAST:  60mL OMNIPAQUE IOHEXOL 350 MG/ML SOLN

[Series 5: thins · axial · 0.61mm/px · z∈[-569,-346]mm · 15 of 245 slices shown]
[im 11/245  lung]
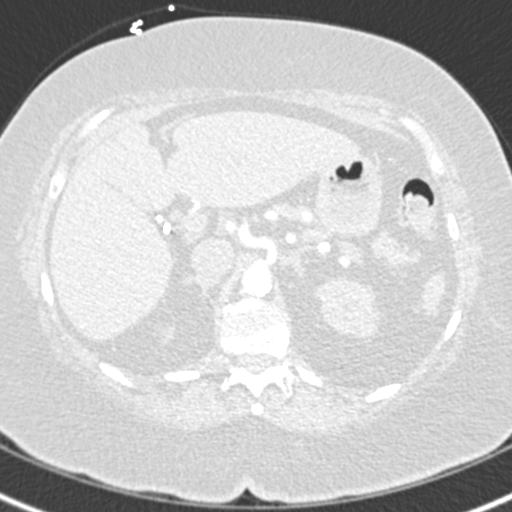
[im 32/245  soft-tissue]
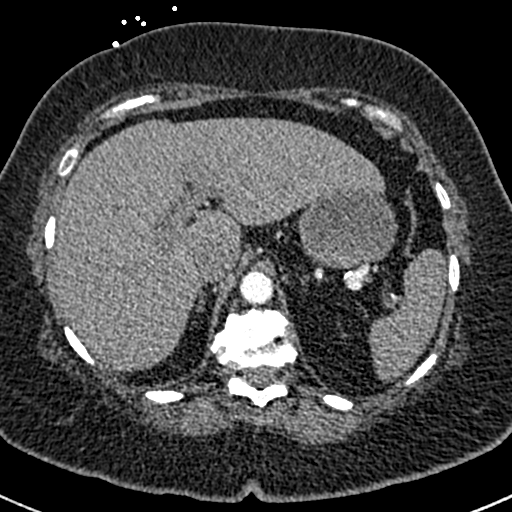
[im 43/245  lung]
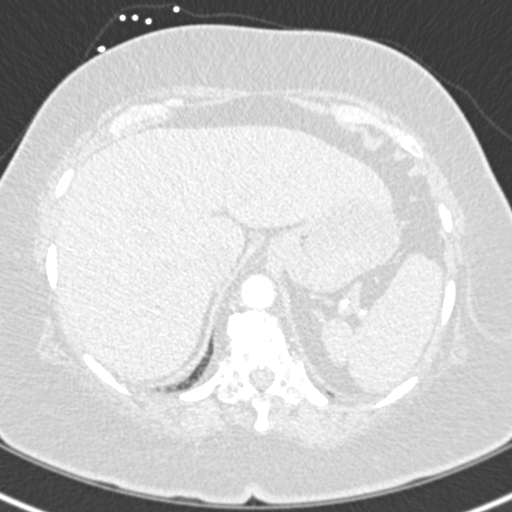
[im 64/245  soft-tissue]
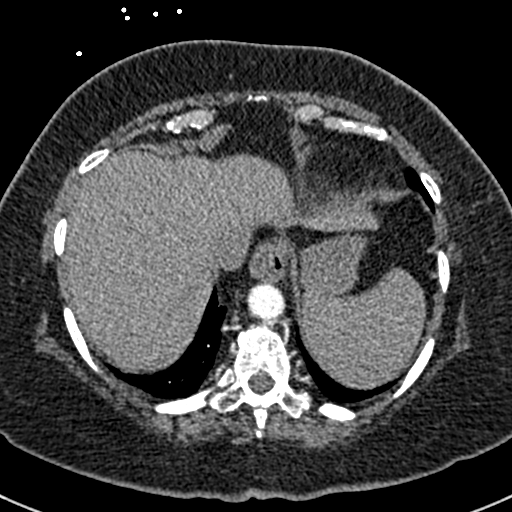
[im 75/245  lung]
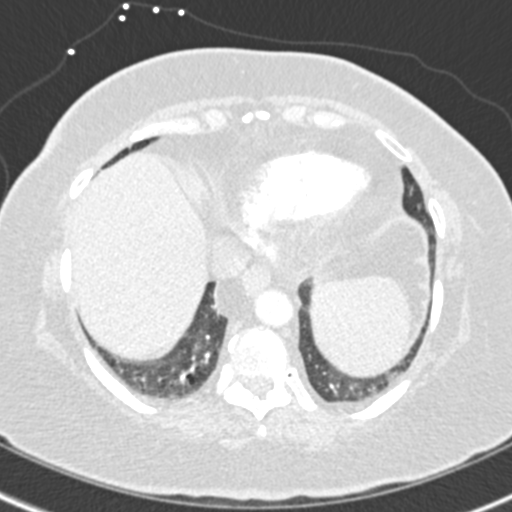
[im 96/245  soft-tissue]
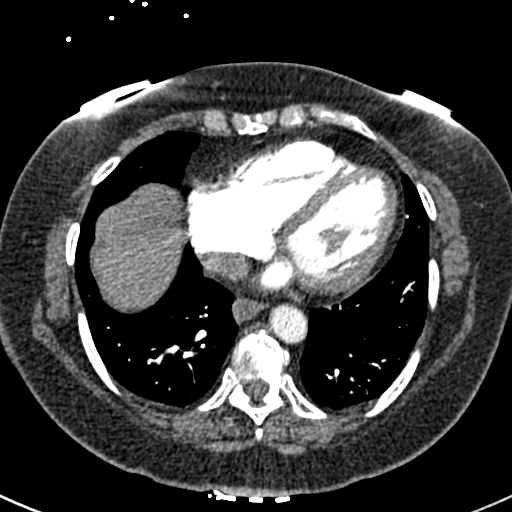
[im 107/245  lung]
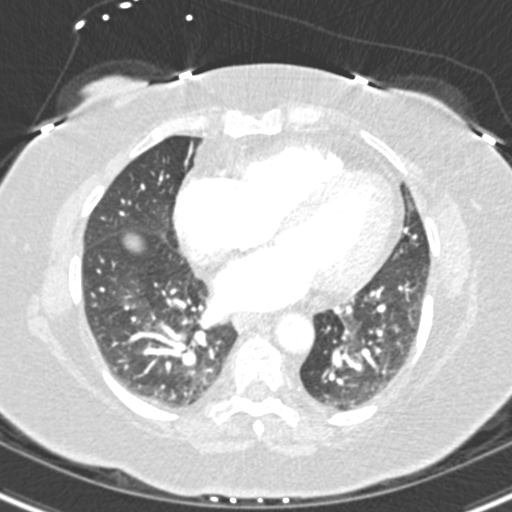
[im 128/245  soft-tissue]
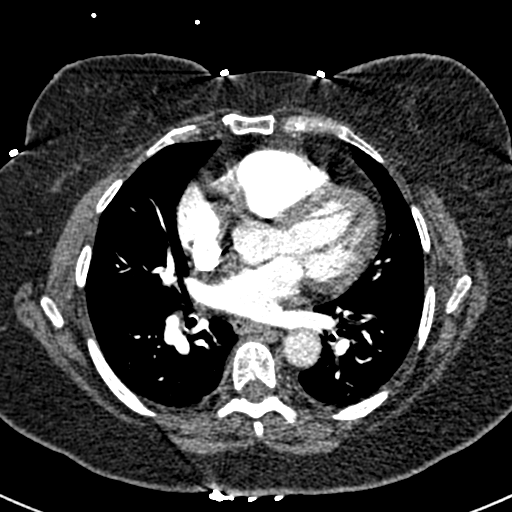
[im 138/245  lung]
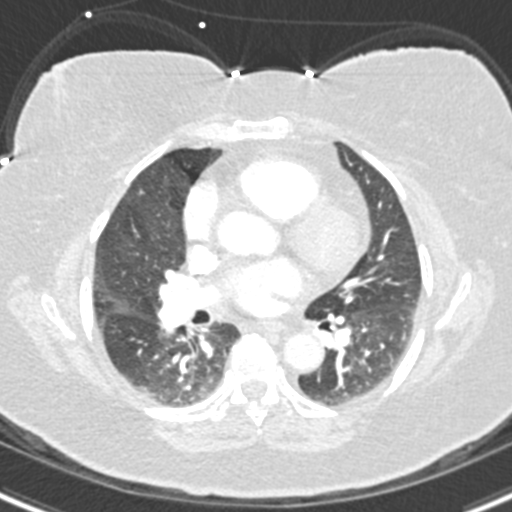
[im 149/245  soft-tissue]
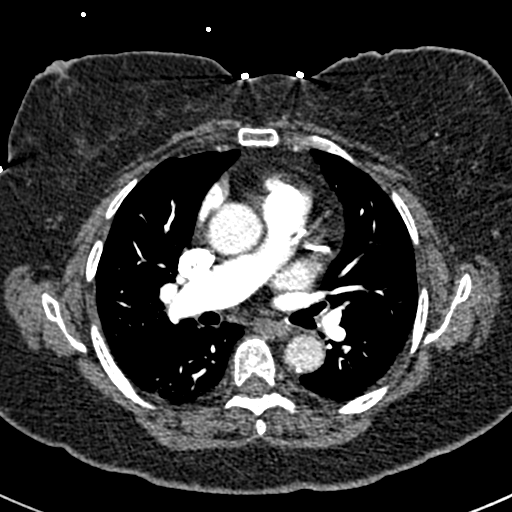
[im 170/245  lung]
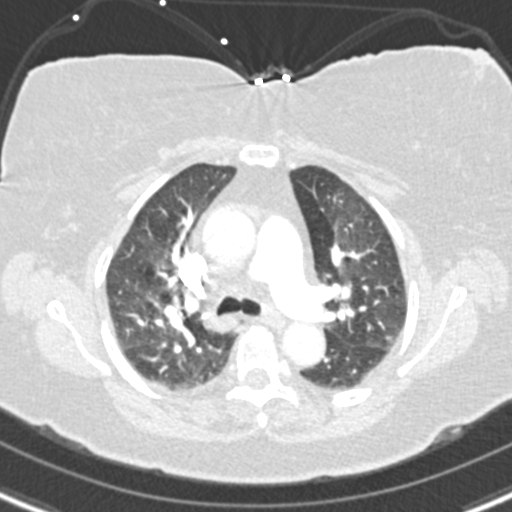
[im 181/245  soft-tissue]
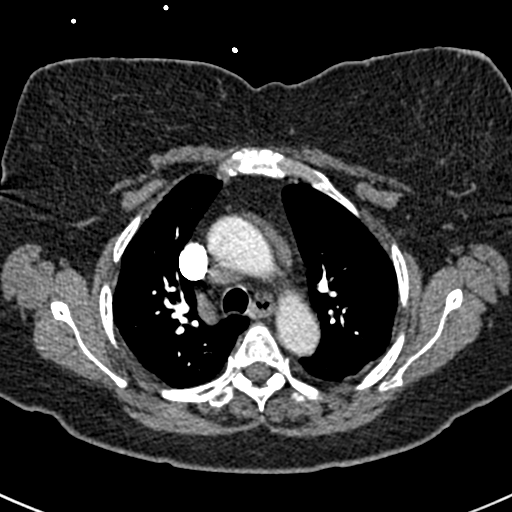
[im 202/245  lung]
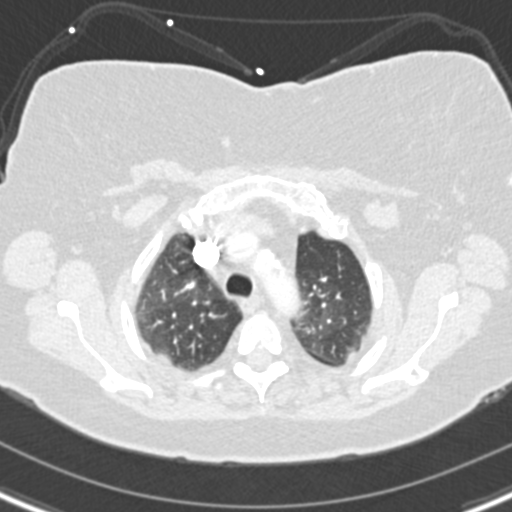
[im 213/245  soft-tissue]
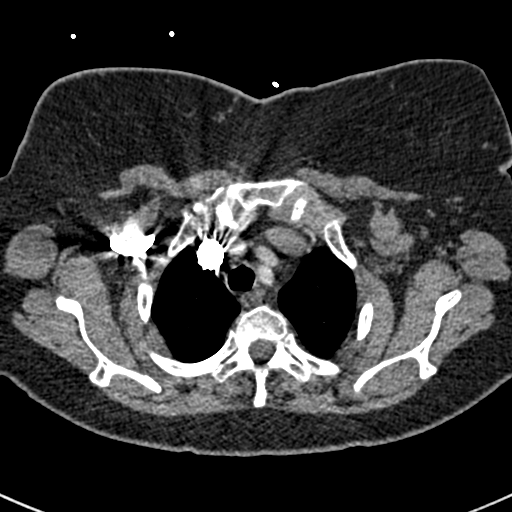
[im 234/245  lung]
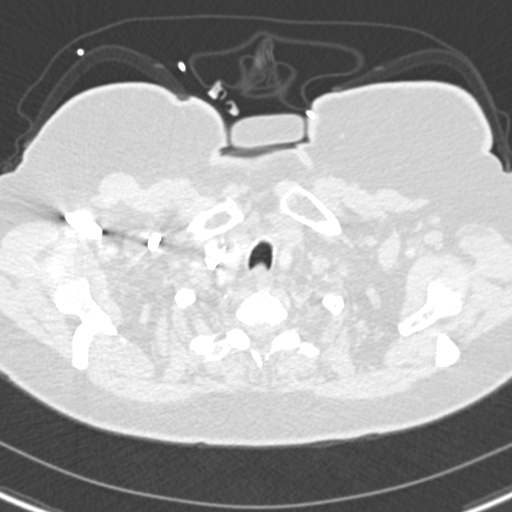

[Series 7: coronal mpr · coronal · 0.50mm/px · 3 of 84 slices shown]
[im 21/84  soft-tissue]
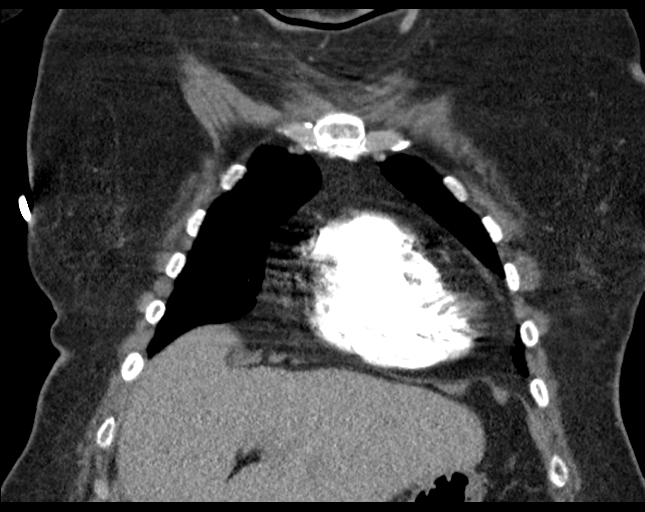
[im 42/84  soft-tissue]
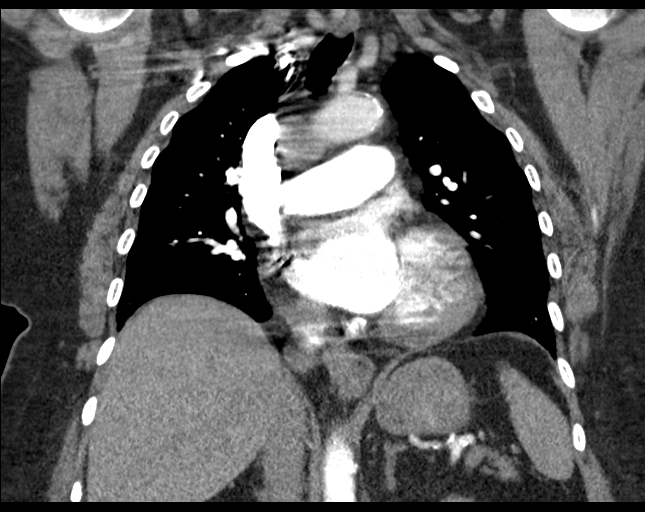
[im 63/84  soft-tissue]
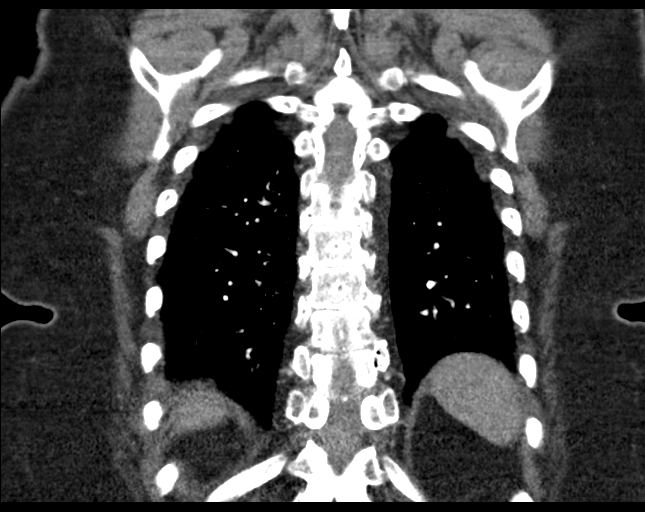

[18 of 46 positions shown; findings below may reference images not displayed]

FINDINGS: Cardiovascular: There is mild to moderate cardiomegaly. No
pericardial effusion. Mild atherosclerotic calcification of the
thoracic aorta. No pulmonary artery embolus identified.

Mediastinum/Nodes: There is no hilar or mediastinal adenopathy.
There is a small hiatal hernia. The esophagus is grossly
unremarkable. Thyroidectomy. No mediastinal fluid collection.

Lungs/Pleura: Mild diffuse hazy streaky density throughout the
lungs, likely atelectasis. No focal consolidation, pleural effusion,
or pneumothorax. The central airways are patent.

Upper Abdomen: Cholecystectomy.

Musculoskeletal: Degenerative changes of the spine. No acute osseous
pathology.

Review of the MIP images confirms the above findings.
IMPRESSION: 1. No acute intrathoracic pathology. No CT evidence of pulmonary
artery embolus.
2. Mild to moderate cardiomegaly.
3. Aortic Atherosclerosis ([id number]).

## 2021-09-04 ENCOUNTER — Emergency Department: Payer: Medicare Other

## 2021-09-04 ENCOUNTER — Emergency Department
Admission: EM | Admit: 2021-09-04 | Discharge: 2021-10-02 | Disposition: E | Payer: Medicare Other | Attending: Emergency Medicine | Admitting: Emergency Medicine

## 2021-09-04 ENCOUNTER — Other Ambulatory Visit: Payer: Self-pay

## 2021-09-04 DIAGNOSIS — I468 Cardiac arrest due to other underlying condition: Secondary | ICD-10-CM | POA: Diagnosis not present

## 2021-09-04 DIAGNOSIS — E119 Type 2 diabetes mellitus without complications: Secondary | ICD-10-CM | POA: Insufficient documentation

## 2021-09-04 DIAGNOSIS — I119 Hypertensive heart disease without heart failure: Secondary | ICD-10-CM | POA: Diagnosis not present

## 2021-09-04 DIAGNOSIS — R778 Other specified abnormalities of plasma proteins: Secondary | ICD-10-CM | POA: Insufficient documentation

## 2021-09-04 DIAGNOSIS — I2510 Atherosclerotic heart disease of native coronary artery without angina pectoris: Secondary | ICD-10-CM | POA: Diagnosis not present

## 2021-09-04 DIAGNOSIS — I214 Non-ST elevation (NSTEMI) myocardial infarction: Secondary | ICD-10-CM | POA: Diagnosis not present

## 2021-09-04 DIAGNOSIS — I10 Essential (primary) hypertension: Secondary | ICD-10-CM | POA: Diagnosis present

## 2021-09-04 DIAGNOSIS — F419 Anxiety disorder, unspecified: Secondary | ICD-10-CM | POA: Diagnosis present

## 2021-09-04 DIAGNOSIS — E1122 Type 2 diabetes mellitus with diabetic chronic kidney disease: Secondary | ICD-10-CM

## 2021-09-04 DIAGNOSIS — I25110 Atherosclerotic heart disease of native coronary artery with unstable angina pectoris: Secondary | ICD-10-CM | POA: Diagnosis present

## 2021-09-04 DIAGNOSIS — E785 Hyperlipidemia, unspecified: Secondary | ICD-10-CM | POA: Insufficient documentation

## 2021-09-04 DIAGNOSIS — N183 Chronic kidney disease, stage 3 unspecified: Secondary | ICD-10-CM

## 2021-09-04 DIAGNOSIS — E039 Hypothyroidism, unspecified: Secondary | ICD-10-CM | POA: Diagnosis present

## 2021-09-04 DIAGNOSIS — R0789 Other chest pain: Secondary | ICD-10-CM | POA: Diagnosis present

## 2021-09-04 LAB — BASIC METABOLIC PANEL
Anion gap: 8 (ref 5–15)
BUN: 23 mg/dL (ref 8–23)
CO2: 25 mmol/L (ref 22–32)
Calcium: 9.2 mg/dL (ref 8.9–10.3)
Chloride: 103 mmol/L (ref 98–111)
Creatinine, Ser: 1.25 mg/dL — ABNORMAL HIGH (ref 0.44–1.00)
GFR, Estimated: 44 mL/min — ABNORMAL LOW (ref >60)
Glucose, Bld: 198 mg/dL — ABNORMAL HIGH (ref 70–99)
Potassium: 4.4 mmol/L (ref 3.5–5.1)
Sodium: 136 mmol/L (ref 135–145)

## 2021-09-04 LAB — APTT: aPTT: 31 seconds (ref 24–36)

## 2021-09-04 LAB — CBC
HCT: 42 % (ref 36.0–46.0)
Hemoglobin: 13.7 g/dL (ref 12.0–15.0)
MCH: 32.5 pg (ref 26.0–34.0)
MCHC: 32.6 g/dL (ref 30.0–36.0)
MCV: 99.5 fL (ref 80.0–100.0)
Platelets: 231 10*3/uL (ref 150–400)
RBC: 4.22 MIL/uL (ref 3.87–5.11)
RDW: 12.7 % (ref 11.5–15.5)
WBC: 7.4 10*3/uL (ref 4.0–10.5)
nRBC: 0 % (ref 0.0–0.2)

## 2021-09-04 LAB — CBG MONITORING, ED: Glucose-Capillary: 162 mg/dL — ABNORMAL HIGH (ref 70–99)

## 2021-09-04 LAB — TROPONIN I (HIGH SENSITIVITY): Troponin I (High Sensitivity): 3573 ng/L (ref <18)

## 2021-09-04 MED ORDER — HEPARIN (PORCINE) 25000 UT/250ML-% IV SOLN
750.0000 [IU]/h | INTRAVENOUS | Status: DC
Start: 2021-09-04 — End: 2021-09-05

## 2021-09-04 MED ORDER — METOPROLOL TARTRATE 25 MG PO TABS: 12.5000 mg | ORAL_TABLET | Freq: Two times a day (BID) | ORAL | Status: DC

## 2021-09-04 MED ORDER — EPINEPHRINE 1 MG/10ML IJ SOSY
PREFILLED_SYRINGE | INTRAMUSCULAR | Status: AC
  Filled 2021-09-04: qty 30

## 2021-09-04 MED ORDER — ASPIRIN EC 81 MG PO TBEC: 81.0000 mg | DELAYED_RELEASE_TABLET | Freq: Every day | ORAL | Status: DC

## 2021-09-04 MED ORDER — ASPIRIN 81 MG PO CHEW: 324.0000 mg | CHEWABLE_TABLET | ORAL | Status: DC

## 2021-09-04 MED ORDER — ASPIRIN 300 MG RE SUPP: 300.0000 mg | RECTAL | Status: DC

## 2021-09-04 MED ORDER — EPINEPHRINE HCL 5 MG/250ML IV SOLN IN NS
0.5000 ug/min | INTRAVENOUS | Status: DC
  Filled 2021-09-04: qty 250

## 2021-09-04 MED ORDER — SUCCINYLCHOLINE CHLORIDE 200 MG/10ML IV SOSY
PREFILLED_SYRINGE | INTRAVENOUS | Status: AC
  Filled 2021-09-04: qty 10

## 2021-09-04 MED ORDER — ONDANSETRON HCL 4 MG/2ML IJ SOLN: 4.0000 mg | Freq: Four times a day (QID) | INTRAMUSCULAR | Status: DC | PRN

## 2021-09-04 MED ORDER — NITROGLYCERIN 0.4 MG SL SUBL
0.4000 mg | SUBLINGUAL_TABLET | SUBLINGUAL | Status: DC | PRN
  Administered 2021-09-04 (×2): 0.4 mg via SUBLINGUAL
  Filled 2021-09-04: qty 1

## 2021-09-04 MED ORDER — INSULIN ASPART 100 UNIT/ML IJ SOLN: 0.0000 [IU] | Freq: Every day | INTRAMUSCULAR | Status: DC

## 2021-09-04 MED ORDER — ETOMIDATE 2 MG/ML IV SOLN
INTRAVENOUS | Status: AC
  Filled 2021-09-04: qty 10

## 2021-09-04 MED ORDER — INSULIN ASPART 100 UNIT/ML IJ SOLN: 3.0000 [IU] | Freq: Three times a day (TID) | INTRAMUSCULAR | Status: DC

## 2021-09-04 MED ORDER — INSULIN ASPART 100 UNIT/ML IJ SOLN: 0.0000 [IU] | Freq: Three times a day (TID) | INTRAMUSCULAR | Status: DC

## 2021-09-04 MED ORDER — ACETAMINOPHEN 325 MG PO TABS: 650.0000 mg | ORAL_TABLET | ORAL | Status: DC | PRN

## 2021-09-04 MED ORDER — HEPARIN BOLUS VIA INFUSION
3650.0000 [IU] | Freq: Once | INTRAVENOUS | Status: DC
  Filled 2021-09-04: qty 3650

## 2021-09-04 MED ORDER — ETOMIDATE 2 MG/ML IV SOLN
INTRAVENOUS | Status: DC
Start: 2021-09-04 — End: 2021-09-05
  Filled 2021-09-04: qty 10

## 2021-09-06 LAB — HEMOGLOBIN A1C
Hgb A1c MFr Bld: 6.4 % — ABNORMAL HIGH (ref 4.8–5.6)
Mean Plasma Glucose: 137 mg/dL

## 2021-10-02 DIAGNOSIS — 419620001 Death: Secondary | SNOMED CT | POA: Diagnosis not present

## 2021-10-02 NOTE — ED Notes (Signed)
Request made for transport to the morgue 

## 2021-10-02 NOTE — ED Notes (Signed)
Pt complaint of pain and is clenching her chest. NTG admin at this time. ?

## 2021-10-02 NOTE — ED Notes (Signed)
Pt has no relief with NTG. Will admin another.  ?

## 2021-10-02 NOTE — Consult Note (Signed)
ANTICOAGULATION CONSULT NOTE - Initial Consult ? ?Pharmacy Consult for heparin ?Indication: chest pain/ACS ? ?Allergies  ?Allergen Reactions  ? Ciprofloxacin Other (See Comments)  ?  dysesthesia  ? Lovastatin Other (See Comments)  ?  Muscle pain  ? Penicillins Hives and Other (See Comments)  ?  Has patient had a PCN reaction causing immediate rash, facial/tongue/throat swelling, SOB or lightheadedness with hypotension: No ?Has patient had a PCN reaction causing severe rash involving mucus membranes or skin necrosis: No ?Has patient had a PCN reaction that required hospitalization No ?Has patient had a PCN reaction occurring within the last 10 years: Yes ?If all of the above answers are "NO", then may proceed with Cephalosporin use.  ? Simvastatin Other (See Comments)  ?  Muscle pain  ? ? ?Patient Measurements: ?Height: 4\' 10"  (147.3 cm) ?Weight: 81.6 kg (180 lb) ?IBW/kg (Calculated) : 40.9 ?Heparin Dosing Weight: 60.3kg ? ?Vital Signs: ?Temp: 97.7 ?F (36.5 ?C) (03/04 1623) ?Temp Source: Oral (03/04 1623) ?BP: 135/70 (03/04 1800) ?Pulse Rate: 68 (03/04 1800) ? ?Labs: ?Recent Labs  ?  2021-09-28 ?1623  ?HGB 13.7  ?HCT 42.0  ?PLT 231  ?CREATININE 1.25*  ?TROPONINIHS 3,573*  ? ? ?Estimated Creatinine Clearance: 32.4 mL/min (A) (by C-G formula based on SCr of 1.25 mg/dL (H)). ? ? ?Medical History: ?Past Medical History:  ?Diagnosis Date  ? Arthritis   ? Bleeding of eye, bilateral   ? CKD (chronic kidney disease)   ? Coronary artery disease   ? Diabetes mellitus without complication (HCC)   ? GERD (gastroesophageal reflux disease)   ? Hypertension   ? OSA (obstructive sleep apnea)   ? Renal insufficiency   ? Thyroid disease   ? hypothyroidism  ? Vitamin D deficiency   ? ? ?Medications:  ?PTA: N/A ?Inpatient: Heparin Drip (3/4 >>>) ?Allergies: No AC/APT related allergies ? ?Assessment: ?Suzanne Francis is a 81 y.o. female  who, per cardiology note dated 07/06/2021 has history of NSTEMI with stent placement, HTN, HLD, who  presents to the emergency department today because of concern for chest pain. Pharmacy consulted for management of heparin drip in the setting of ACS ?  ?Date Time aPTT/HL Rate/Comment ? ?Baseline Labs: ?aPTT - ordered ?INR -  ?Hgb - 13.7 ?Plts - 231 ? ?Goal of Therapy:  ?Heparin level 0.3-0.7 units/ml ?Monitor platelets by anticoagulation protocol: Yes ?  ?Plan:  ?Give 3600 units bolus x1; then start heparin infusion at 750 units/hr ?Check anti-Xa level in 8 hours and daily once consecutively therapeutic. ?Continue to monitor H&H and platelets daily while on heparin gtt. ? ? ?     ? ? ? ? ? ? ? ?09/03/2021 ?28-Sep-2021,7:11 PM ? ? ?

## 2021-10-02 NOTE — ED Provider Notes (Addendum)
? ?[facility] ?Provider Note ? ? ? Event Date/Time  ? First MD Initiated Contact with Patient 2021/09/11 1628   ?  (approximate) ? ? ?History  ? ?Chest Pain ? ? ?HPI ? ?Suzanne Francis is a 81 y.o. female  who, per cardiology note dated 07/06/2021 has history of NSTEMI with stent placement, HTN, HLD, who presents to the emergency department today because of concern for chest pain.  Patient states it started about an hour and a half ago while she was driving.  Pain is located in the center part of her chest.  She describes it as pressure and it did radiate into her back and her left arm. She states at the time my exam that her pain is somewhat better.  She did have some shortness of breath when the pain was at its worst.  Patient states it does remind her of previous episodes of coronary disease. ? ? ?Physical Exam  ? ?Triage Vital Signs: ?ED Triage Vitals  ?Enc Vitals Group  ?   BP 09/11/2021 1623 (!) 140/111  ?   Pulse Rate 09/11/21 1623 62  ?   Resp September 11, 2021 1623 16  ?   Temp 09-11-2021 1623 97.7 ?F (36.5 ?C)  ?   Temp Source 09-11-21 1623 Oral  ?   SpO2 09/11/21 1623 97 %  ?   Weight September 11, 2021 1620 180 lb (81.6 kg)  ?   Height 2021-09-11 1620 4\' 10"  (1.473 m)  ?   Head Circumference --   ?   Peak Flow --   ?   Pain Score Sep 11, 2021 1620 2  ?    ?    ?    ? ? ?Most recent vital signs: ?Vitals:  ? Sep 11, 2021 1623  ?BP: (!) 140/111  ?Pulse: 62  ?Resp: 16  ?Temp: 97.7 ?F (36.5 ?C)  ?SpO2: 97%  ? ? ?General: Awake, no distress.  ?CV:  Good peripheral perfusion. Regular rate and rhythm. ?Resp:  Normal effort. Clear to auscultation. ?Abd:  No distention.  ? ? ? ?ED Results / Procedures / Treatments  ? ?Labs ?(all labs ordered are listed, but only abnormal results are displayed) ?Labs Reviewed  ?BASIC METABOLIC PANEL - Abnormal; Notable for the following components:  ?    Result Value  ? Glucose, Bld 198 (*)   ? Creatinine, Ser 1.25 (*)   ? GFR, Estimated 44 (*)   ? All other components within normal limits   ?TROPONIN I (HIGH SENSITIVITY) - Abnormal; Notable for the following components:  ? Troponin I (High Sensitivity) 3,573 (*)   ? All other components within normal limits  ?CBC  ?TROPONIN I (HIGH SENSITIVITY)  ? ? ? ?EKG ? ?I05/04/23, attending physician, personally viewed and interpreted this EKG ? ?EKG Time: 1622 ?Rate: 60 ?Rhythm: sinus rhythm ?Axis: left axis deviation ?Intervals: qtc 455 ?QRS: narrow, q waves III, aVF, V3 ?ST changes: no st elevation ?Impression: abnormal ekg ? ?RADIOLOGY ?CXR ?I independently interpreted and visualized the CXR. My interpretation: No pneumonia. No pneumothorax. ?Radiology interpretation:  ?IMPRESSION:  ?No acute cardiopulmonary abnormality.  ? ? ? ?PROCEDURES: ? ?Critical Care performed: Yes, see critical care procedure note(s) ? ?Procedures ?CRITICAL CARE ?Performed by: Phineas Semen ? ? ?Total critical care time: 35 minutes ? ?Critical care time was exclusive of separately billable procedures and treating other patients. ? ?Critical care was necessary to treat or prevent imminent or life-threatening deterioration. ? ?Critical care was time spent personally by me on  the following activities: development of treatment plan with patient and/or surrogate as well as nursing, discussions with consultants, evaluation of patient's response to treatment, examination of patient, obtaining history from patient or surrogate, ordering and performing treatments and interventions, ordering and review of laboratory studies, ordering and review of radiographic studies, pulse oximetry and re-evaluation of patient's condition. ? ? ?MEDICATIONS ORDERED IN ED: ?Medications - No data to display ? ? ?IMPRESSION / MDM / ASSESSMENT AND PLAN / ED COURSE  ?I reviewed the triage vital signs and the nursing notes. ?             ?               ? ?Differential diagnosis includes, but is not limited to, ACS, pneumonia, pneumothorax. ? ?Patient presented to the emergency department today because  of concerns for chest pain.  Patient has a history of coronary artery disease.  EKG without ST elevation.  Chest x-ray without pneumonia or pneumothorax.  Troponin was elevated at 3573.  Will start heparin. Discussed findings and plan with patient. Notified patient's cardiologist Dr. Darrold Junker. Discussed with hospitalist who will plan on admission.  ? ? ? ?After I had discussed with the hospitalist for admission I was called into the room because patient had lost pulses.  Apparently nurse was in the room giving nitroglycerin when the patient planing of worsening chest pain.  She then became unresponsive, displayed bradycardia and went pulseless.  CPR was initiated.  Please see nursing documentation for exact medications and timing of medications given.  The patient did receive multiple rounds of epinephrine and initially appeared to be responding somewhat to the epinephrine.  We did have a brief episode of ROSC and repeat EKG was done.  While this still did not show any obvious ST elevation I discussed with Dr. Carlynn Purl shows with cardiology.  Dr. Graciela Husbands did come to evaluate the patient however unfortunately prior to his arrival she again went pulseless.  Rhythm continued to be PEA.  Patient was intubated.  After multiple further rounds of CPR and without any further ROSC, patient was pronounced dead. ? ?FINAL CLINICAL IMPRESSION(S) / ED DIAGNOSES  ? ?Final diagnoses:  ?NSTEMI (non-ST elevated myocardial infarction) (HCC)  ?Cardiopulmonary Arrest ? ? ? ? ?Note:  This document was prepared using Dragon voice recognition software and may include unintentional dictation errors. ? ?  ?Phineas Semen, MD ?09-23-21 1821 ? ?  ?Phineas Semen, MD ?09/23/21 1930 ? ?

## 2021-10-02 NOTE — ED Notes (Signed)
Pt still complaint of CP. MD made aware. Verbal orders to give SL NTG. ?

## 2021-10-02 NOTE — Progress Notes (Addendum)
Patient expired in ED prior to me seeing her. I had already started admission process without knowledge that she had decompensated. Please refer to ED documentation and please send death certificate to ED physician.  ?

## 2021-10-02 NOTE — Progress Notes (Signed)
Chaplain Maggie offered support to pt's son during code and after her death. Ministry of presence, grief support and prayer were central to this visit. Pt's son was very appreciative of the care the team offered to his mom. ?

## 2021-10-02 NOTE — ED Triage Notes (Signed)
BIB [facility] from home. Driving home from [place] and started having CP. 10/10 and constant x 1 hour. Left sided worse with palpation. Now 2/10 after going home and resting. Pt took 1 AS prior to EMS arrival. EMS gave the other 3. Pt has cardiac HX with stents palced. 12lead SB HR 58 for EMS with PVC  ?134/90 ?99% ?132 BGL ? ?

## 2021-10-02 NOTE — Assessment & Plan Note (Signed)
Cardiology following, ED physician spoke w/ on-call cardiologist ?Heparin gtt ?BB, Statin, ARB/ACE ?

## 2021-10-02 NOTE — ED Notes (Signed)
Code narrator as follows:  ?1831- pt with loss of pulse, PEA on monitor, CPR started ?1832- one amp epi given iv ?1834- pulse check, PEA, one amp epi IV given ?1836- pulse check, PEA, one amp epi IV given ?1838- pulse check, PEA, one amp epi IV given, Right tibial IO initiated by crystal rn ?1840- pulse check, PEA, one amp epi given IV, one amp sodium bicarb IV given ?1842- pulse check, PEA, one amp epi given IV ?1844- pulse check, ROSC, dr. Celene Squibb called by dr. Derrill Kay ?1850- pt with loss of pulse, PEA, one amp epi given IV ?1853- pulse check, PEA, one amp epi given IV ?1855- pulse check, PEA, one amp epi given IV, epi drip initiated at 75mcg/min ?1856- 20mg  etomidate IV given, 100mg  succinylchoyline iv given, FSBS 162 via capillary sample ?1857- additional 50mg  succinylcholie given IV ?1858- pulse check, PEA, one amp epi given IV, pt intubated by dr. with 7.5ETT  23 at lips, color change noted ?1859- epi drip increased to 50mcg/min ?1900-pulse check, PEA, one amp epi given IV ?1901- attempted pacing per dr. at Derrill Kay  ?1902- no capture on pacing, pulse check, PEA, one amp epi given IV ?1903- one amp epi atropine given IV, epi drip increased to 44mcg/min ?1904- pulse check, PEA, one amp epi given IV, one amp sodium bicarb given IV ?1905- cardiologist arrives, one amp calcium chloride given IV ?1908- pulse check, PEA ?1909- one amp epi given IV ?1910- pulse check, PEA ?1912- pulse check, PEA ?Time of Death 19:12, called by dr. Derrill Kay ?LUCAS used for chest compressions after 1834 ?Drugs administered by , rn and 12m, rn ?Pt bagged by RT and crystal guildoni, rn ?Dr. Derrill Kay physician in charge ?This Rn recording code ?

## 2021-10-02 DEATH — deceased
